# Patient Record
Sex: Male | Born: 1980 | Race: Black or African American | Hispanic: No | Marital: Single | Smoking: Never smoker
Health system: Southern US, Community
[De-identification: ages and names within clinical notes are randomized; demographics above are authoritative.]

## PROBLEM LIST (undated history)

## (undated) DIAGNOSIS — N390 Urinary tract infection, site not specified: Secondary | ICD-10-CM

## (undated) DIAGNOSIS — E669 Obesity, unspecified: Secondary | ICD-10-CM

---
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
## 2015-03-22 ENCOUNTER — Emergency Department (HOSPITAL_BASED_OUTPATIENT_CLINIC_OR_DEPARTMENT_OTHER)
Admission: EM | Admit: 2015-03-22 | Discharge: 2015-03-22 | Disposition: A | Discharge status: Home / Self Care | Payer: BLUE CROSS/BLUE SHIELD | Attending: Emergency Medicine | Admitting: Emergency Medicine

## 2015-03-22 ENCOUNTER — Encounter (HOSPITAL_BASED_OUTPATIENT_CLINIC_OR_DEPARTMENT_OTHER): Payer: Self-pay | Admitting: Emergency Medicine

## 2015-03-22 DIAGNOSIS — R300 Dysuria: Secondary | ICD-10-CM | POA: Diagnosis present

## 2015-03-22 DIAGNOSIS — N390 Urinary tract infection, site not specified: Secondary | ICD-10-CM | POA: Diagnosis not present

## 2015-03-22 DIAGNOSIS — E669 Obesity, unspecified: Secondary | ICD-10-CM | POA: Insufficient documentation

## 2015-03-22 HISTORY — DX: Obesity, unspecified: E66.9

## 2015-03-22 HISTORY — DX: Urinary tract infection, site not specified: N39.0

## 2015-03-22 LAB — URINALYSIS, ROUTINE W REFLEX MICROSCOPIC
BILIRUBIN URINE: NEGATIVE
GLUCOSE, UA: NEGATIVE mg/dL
KETONES UR: NEGATIVE mg/dL
NITRITE: NEGATIVE
PROTEIN: NEGATIVE mg/dL
Specific Gravity, Urine: 1.005 (ref 1.005–1.030)
Urobilinogen, UA: 0.2 mg/dL (ref 0.0–1.0)
pH: 7 (ref 5.0–8.0)

## 2015-03-22 LAB — GC/CHLAMYDIA PROBE AMP (~~LOC~~) NOT AT ARMC
Chlamydia: NEGATIVE
NEISSERIA GONORRHEA: NEGATIVE

## 2015-03-22 LAB — URINE MICROSCOPIC-ADD ON

## 2015-03-22 MED ORDER — CEFTRIAXONE SODIUM 1 G IJ SOLR
1.0000 g | Freq: Once | INTRAMUSCULAR | Status: AC
  Administered 2015-03-22: 1 g via INTRAMUSCULAR
  Filled 2015-03-22: qty 10

## 2015-03-22 MED ORDER — CEPHALEXIN 500 MG PO CAPS: 500.0000 mg | ORAL_CAPSULE | Freq: Four times a day (QID) | ORAL | Status: DC

## 2015-03-22 MED ORDER — AZITHROMYCIN 1 G PO PACK
1.0000 g | PACK | Freq: Once | ORAL | Status: AC
  Administered 2015-03-22: 1 g via ORAL
  Filled 2015-03-22: qty 1

## 2015-03-22 MED ORDER — PHENAZOPYRIDINE HCL 100 MG PO TABS
200.0000 mg | ORAL_TABLET | Freq: Once | ORAL | Status: AC
  Administered 2015-03-22: 200 mg via ORAL
  Filled 2015-03-22: qty 2

## 2015-03-22 NOTE — ED Notes (Signed)
Dr. Nicanor Alcon in to see pt, urethral swab obtained.

## 2015-03-22 NOTE — ED Provider Notes (Signed)
CSN: [id number]     Arrival date & time 03/22/15  0110 History   First MD Initiated Contact with Patient 03/22/15 0139     Chief Complaint  Patient presents with  . Dysuria     (Consider location/radiation/quality/duration/timing/severity/associated sxs/prior Treatment) Patient is a 34 y.o. male presenting with dysuria. The history is provided by the patient.  Dysuria This is a recurrent problem. The current episode started 1 to 2 hours ago. The problem occurs constantly. The problem has not changed since onset.Pertinent negatives include no chest pain, no abdominal pain, no headaches and no shortness of breath. Nothing aggravates the symptoms. Nothing relieves the symptoms. He has tried nothing for the symptoms. The treatment provided no relief.    Past Medical History  Diagnosis Date  . UTI (lower urinary tract infection)   . Obesity    History reviewed. No pertinent past surgical history. No family history on file. Social History  Substance Use Topics  . Smoking status: Never Smoker   . Smokeless tobacco: None  . Alcohol Use: No    Review of Systems  Respiratory: Negative for shortness of breath.   Cardiovascular: Negative for chest pain.  Gastrointestinal: Negative for abdominal pain.  Genitourinary: Positive for dysuria. Negative for difficulty urinating.  Neurological: Negative for headaches.  All other systems reviewed and are negative.     Allergies  Review of patient's allergies indicates no known allergies.  Home Medications   Prior to Admission medications   Medication Sig Start Date End Date Taking? Authorizing Provider  cephALEXin (KEFLEX) 500 MG capsule Take 1 capsule (500 mg total) by mouth 4 (four) times daily. 03/22/15   April Palumbo, MD   BP 151/101 mmHg  Pulse 92  Temp(Src) 100.5 F (38.1 C) (Oral)  Resp 22  Ht  (1.88 m)  Wt 349 lb (158.305 kg)  BMI 44.79 kg/m2  SpO2 100% Physical Exam  Constitutional: He is oriented to person, place,  and time. He appears well-developed and well-nourished. No distress.  HENT:  Head: Normocephalic and atraumatic.  Mouth/Throat: Oropharynx is clear and moist.  Eyes: Conjunctivae are normal. Pupils are equal, round, and reactive to light.  Neck: Normal range of motion. Neck supple.  Cardiovascular: Normal rate, regular rhythm and intact distal pulses.   Pulmonary/Chest: Effort normal and breath sounds normal. No respiratory distress. He has no wheezes. He has no rales.  Abdominal: Soft. Bowel sounds are normal. There is no tenderness. There is no rebound and no guarding.  Musculoskeletal: Normal range of motion.  Neurological: He is alert and oriented to person, place, and time.  Skin: Skin is warm and dry.  Psychiatric: He has a normal mood and affect.    ED Course  Procedures (including critical care time) Labs Review Labs Reviewed  URINALYSIS, ROUTINE W REFLEX MICROSCOPIC (NOT AT [facility]) - Abnormal; Notable for the following:    APPearance CLOUDY (*)    Hgb urine dipstick LARGE (*)    Leukocytes, UA LARGE (*)    All other components within normal limits  URINE MICROSCOPIC-ADD ON  GC/CHLAMYDIA PROBE AMP ([place]) NOT AT [facility]    Imaging Review No results found. I have personally reviewed and evaluated these images and lab results as part of my medical decision-making.   EKG Interpretation None      MDM   Final diagnoses:  UTI (lower urinary tract infection)    Medications  cefTRIAXone (ROCEPHIN) injection 1 g (1 g Intramuscular Given 03/22/15 0252)  azithromycin (ZITHROMAX) powder  1 g (1 g Oral Given 03/22/15 0253)  phenazopyridine (PYRIDIUM) tablet 200 mg (200 mg Oral Given 03/22/15 0334)    As this is a second visit for UTI in a male will refer to urology for further testing keflex started.  Return for fevers intractable vomiting weakness or any concerns    April Palumbo, MD 03/22/15 [phone]

## 2015-03-22 NOTE — ED Notes (Signed)
MD at bedside. 

## 2015-03-22 NOTE — ED Notes (Signed)
Urinary frequency and dysuria since 10pm last evening.

## 2021-04-12 ENCOUNTER — Emergency Department (HOSPITAL_COMMUNITY): Payer: BC Managed Care – PPO

## 2021-04-12 ENCOUNTER — Encounter (HOSPITAL_COMMUNITY): Payer: Self-pay

## 2021-04-12 ENCOUNTER — Inpatient Hospital Stay (HOSPITAL_COMMUNITY)
Admission: EM | Admit: 2021-04-12 | Discharge: 2021-04-16 | DRG: 519 | Disposition: A | Discharge status: Home / Self Care | Payer: BC Managed Care – PPO | Attending: Neurosurgery | Admitting: Neurosurgery

## 2021-04-12 ENCOUNTER — Other Ambulatory Visit: Payer: Self-pay

## 2021-04-12 DIAGNOSIS — I160 Hypertensive urgency: Secondary | ICD-10-CM | POA: Diagnosis present

## 2021-04-12 DIAGNOSIS — E876 Hypokalemia: Secondary | ICD-10-CM | POA: Diagnosis present

## 2021-04-12 DIAGNOSIS — Z6841 Body Mass Index (BMI) 40.0 and over, adult: Secondary | ICD-10-CM

## 2021-04-12 DIAGNOSIS — Z419 Encounter for procedure for purposes other than remedying health state, unspecified: Secondary | ICD-10-CM

## 2021-04-12 DIAGNOSIS — M4804 Spinal stenosis, thoracic region: Secondary | ICD-10-CM | POA: Diagnosis present

## 2021-04-12 DIAGNOSIS — Z20822 Contact with and (suspected) exposure to covid-19: Secondary | ICD-10-CM | POA: Diagnosis present

## 2021-04-12 DIAGNOSIS — M4800 Spinal stenosis, site unspecified: Secondary | ICD-10-CM | POA: Diagnosis present

## 2021-04-12 DIAGNOSIS — I10 Essential (primary) hypertension: Secondary | ICD-10-CM | POA: Diagnosis present

## 2021-04-12 DIAGNOSIS — G959 Disease of spinal cord, unspecified: Secondary | ICD-10-CM | POA: Diagnosis present

## 2021-04-12 LAB — MAGNESIUM: Magnesium: 1.8 mg/dL (ref 1.7–2.4)

## 2021-04-12 LAB — COMPREHENSIVE METABOLIC PANEL
ALT: 29 U/L (ref 0–44)
AST: 24 U/L (ref 15–41)
Albumin: 4 g/dL (ref 3.5–5.0)
Alkaline Phosphatase: 83 U/L (ref 38–126)
Anion gap: 9 (ref 5–15)
BUN: 10 mg/dL (ref 6–20)
CO2: 28 mmol/L (ref 22–32)
Calcium: 8.4 mg/dL — ABNORMAL LOW (ref 8.9–10.3)
Chloride: 101 mmol/L (ref 98–111)
Creatinine, Ser: 1.23 mg/dL (ref 0.61–1.24)
GFR, Estimated: >60 mL/min (ref >60)
Glucose, Bld: 120 mg/dL — ABNORMAL HIGH (ref 70–99)
Potassium: 2.9 mmol/L — ABNORMAL LOW (ref 3.5–5.1)
Sodium: 138 mmol/L (ref 135–145)
Total Bilirubin: 0.5 mg/dL (ref 0.3–1.2)
Total Protein: 7.6 g/dL (ref 6.5–8.1)

## 2021-04-12 LAB — CBC WITH DIFFERENTIAL/PLATELET
Abs Immature Granulocytes: 0.02 10*3/uL (ref 0.00–0.07)
Basophils Absolute: 0.1 10*3/uL (ref 0.0–0.1)
Basophils Relative: 1 %
Eosinophils Absolute: 0.2 10*3/uL (ref 0.0–0.5)
Eosinophils Relative: 2 %
HCT: 42.9 % (ref 39.0–52.0)
Hemoglobin: 13.7 g/dL (ref 13.0–17.0)
Immature Granulocytes: 0 %
Lymphocytes Relative: 22 %
Lymphs Abs: 1.8 10*3/uL (ref 0.7–4.0)
MCH: 27.8 pg (ref 26.0–34.0)
MCHC: 31.9 g/dL (ref 30.0–36.0)
MCV: 87 fL (ref 80.0–100.0)
Monocytes Absolute: 0.6 10*3/uL (ref 0.1–1.0)
Monocytes Relative: 8 %
Neutro Abs: 5.4 10*3/uL (ref 1.7–7.7)
Neutrophils Relative %: 67 %
Platelets: 333 10*3/uL (ref 150–400)
RBC: 4.93 MIL/uL (ref 4.22–5.81)
RDW: 13 % (ref 11.5–15.5)
WBC: 8 10*3/uL (ref 4.0–10.5)
nRBC: 0 % (ref 0.0–0.2)

## 2021-04-12 LAB — RESP PANEL BY RT-PCR (FLU A&B, COVID) ARPGX2
Influenza A by PCR: NEGATIVE
Influenza B by PCR: NEGATIVE
SARS Coronavirus 2 by RT PCR: NEGATIVE

## 2021-04-12 IMAGING — MR MR THORACIC SPINE W/O CM
6 series · 42 of 48 positions shown · non-contrast
Comparison: None.

CLINICAL DATA: Pain, progressive neurologic deficit, fall from
lower extremity weakness, with increasing lower extremity weakness
over the week.

EXAM:
MRI CERVICAL, THORACIC AND LUMBAR SPINE WITHOUT CONTRAST
TECHNIQUE: Multiplanar and multiecho pulse sequences of the cervical spine, to
include the craniocervical junction and cervicothoracic junction,
and thoracic and lumbar spine, were obtained without intravenous
contrast.

[Series 16: T1 · sagittal · 4.0mm · 1.72mm/px · 1 of 5 slices shown (1 of 2)]
[im 1/5]
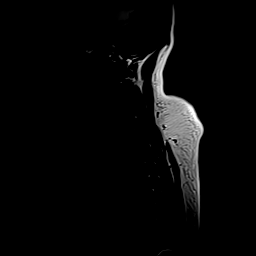

[Series 17: STIR · sagittal · 3.0mm · 1.09mm/px · 5 of 16 slices shown]
[im 1/16]
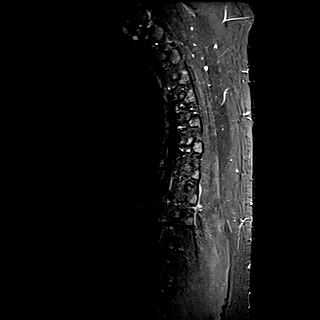
[im 4/16]
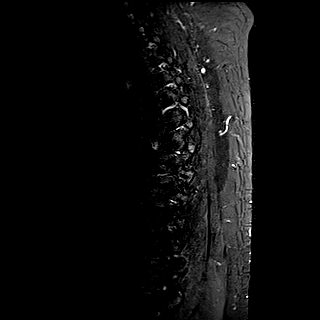
[im 8/16]
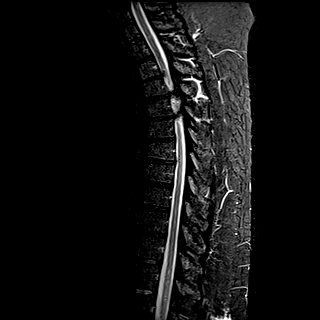
[im 12/16]
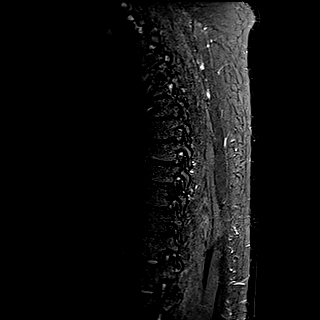
[im 16/16]
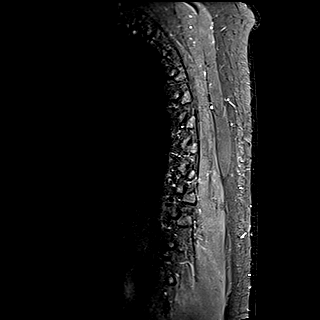

[Series 18: T1 · sagittal · 3.0mm · 1.09mm/px · 6 of 16 slices shown (2 of 2)]
[im 1/16]
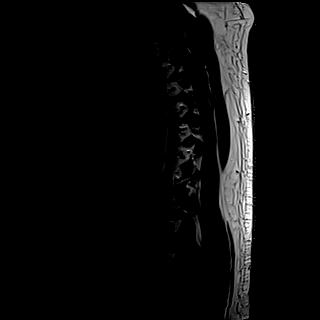
[im 4/16]
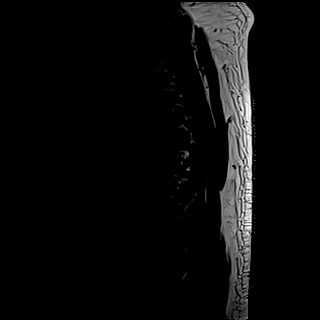
[im 7/16]
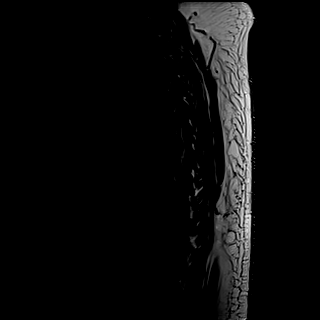
[im 10/16]
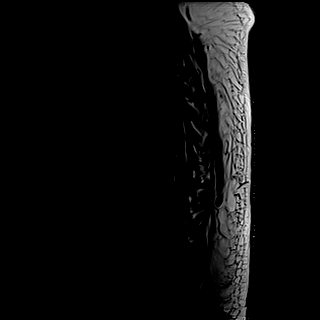
[im 13/16]
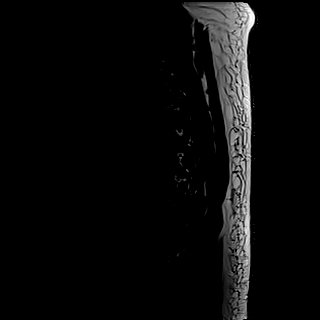
[im 16/16]
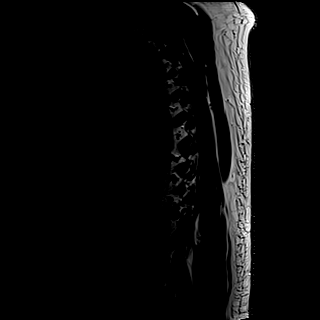

[Series 19: T2 · sagittal · 3.0mm · 0.91mm/px · 6 of 16 slices shown (1 of 2)]
[im 1/16]
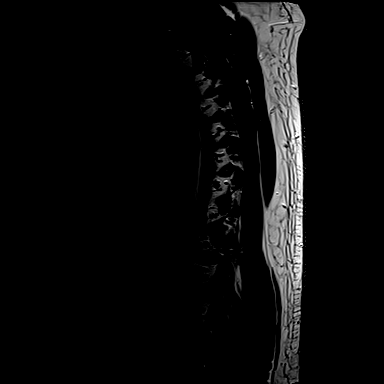
[im 4/16]
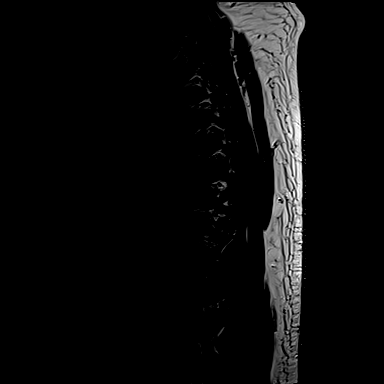
[im 7/16]
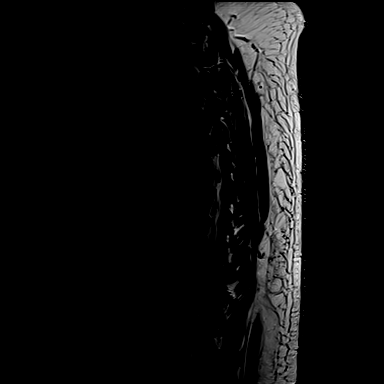
[im 10/16]
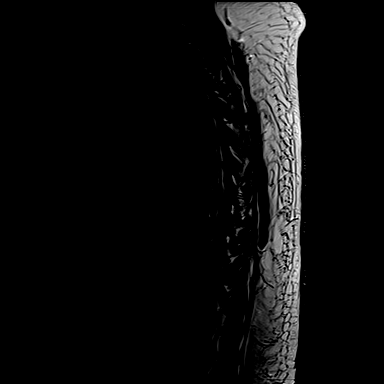
[im 13/16]
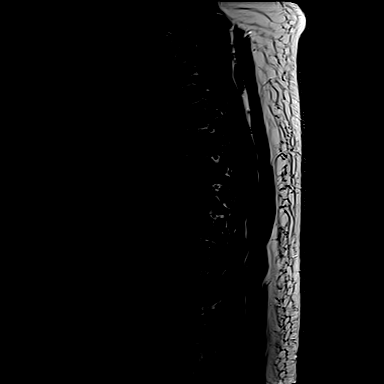
[im 16/16]
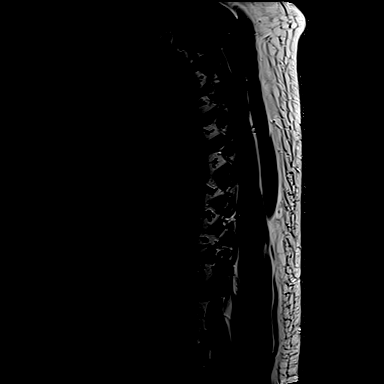

[Series 20: T2 · axial · 4.0mm · 0.78mm/px · z∈[-335,-78]mm · 15 of 42 slices shown (2 of 2)]
[im 1/42]
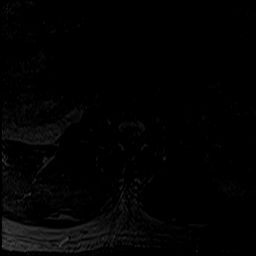
[im 3/42]
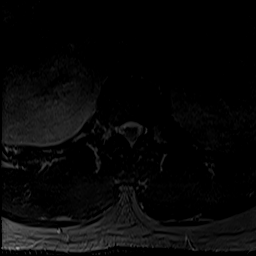
[im 6/42]
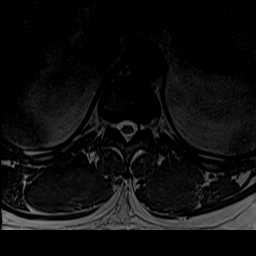
[im 9/42]
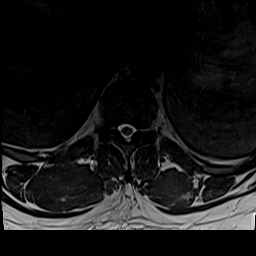
[im 12/42]
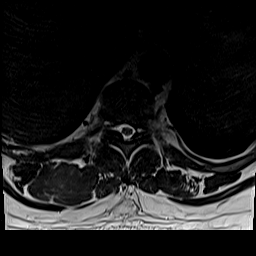
[im 15/42]
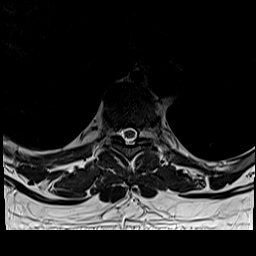
[im 18/42]
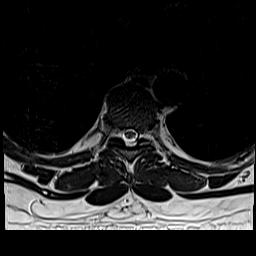
[im 21/42]
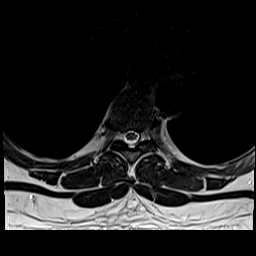
[im 24/42]
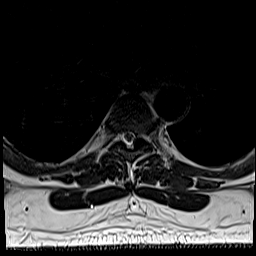
[im 27/42]
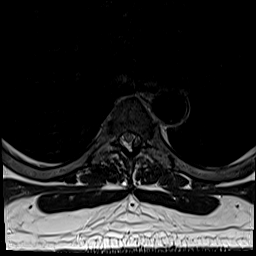
[im 30/42]
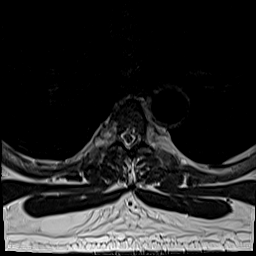
[im 33/42]
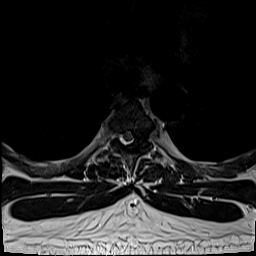
[im 36/42]
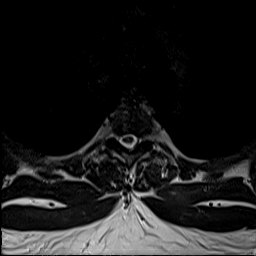
[im 39/42]
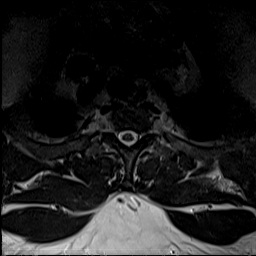
[im 42/42]
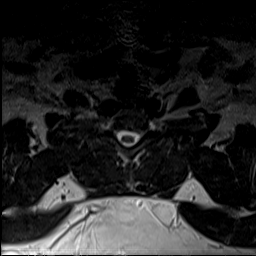

[Series 21: t2_me2d_tra · axial · 4.0mm · 0.52mm/px · z∈[-335,-78]mm · 9 of 42 slices shown]
[im 1/42]
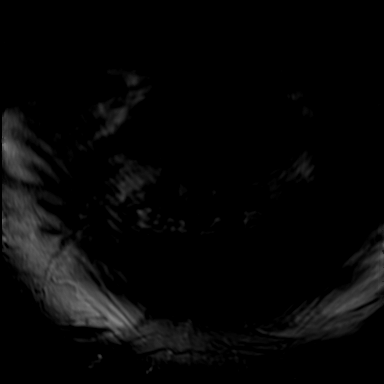
[im 3/42]
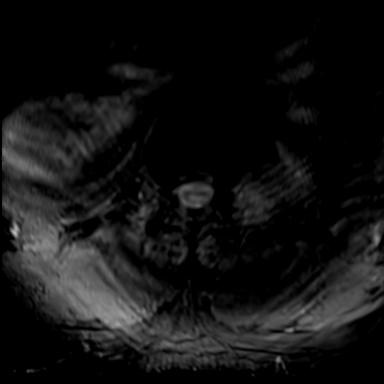
[im 6/42]
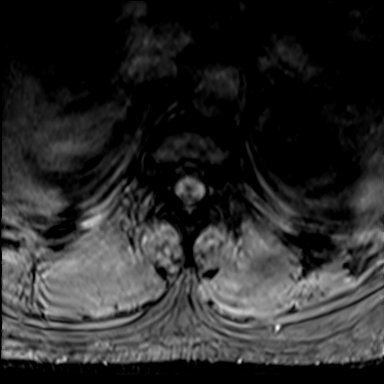
[im 12/42]
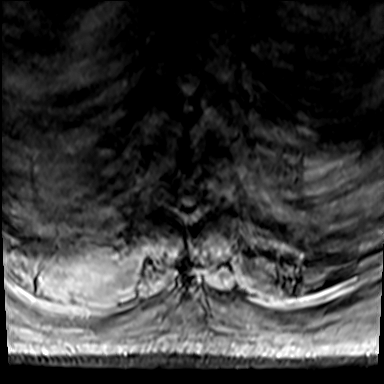
[im 18/42]
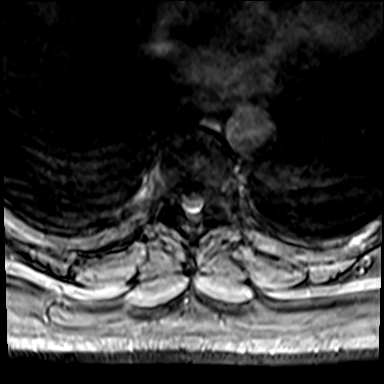
[im 24/42]
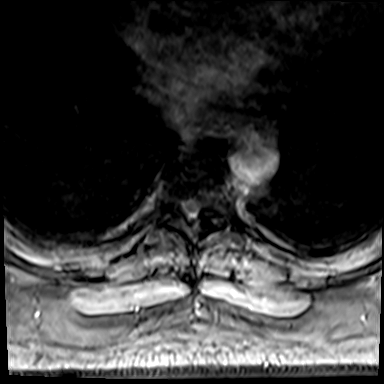
[im 30/42]
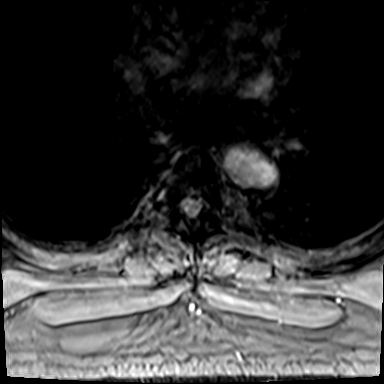
[im 36/42]
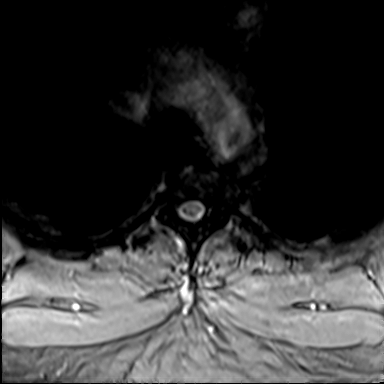
[im 42/42]
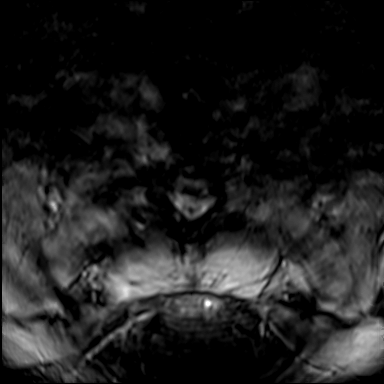

[42 of 48 positions shown; findings below may reference images not displayed]

FINDINGS: MRI CERVICAL SPINE FINDINGS

Alignment: Physiologic.

Vertebrae: No fracture, evidence of discitis, or bone lesion.

Cord: Normal signal and morphology.

Posterior Fossa, vertebral arteries, paraspinal tissues: Negative.

Disc levels:

C4-C5 mild left neural foraminal narrowing. No spinal canal
stenosis.

MRI THORACIC SPINE FINDINGS

Alignment:  Physiologic.

Vertebrae: No acute fracture. Osseous expansion of the vertebral
body and posterior elements of T4 (series 20, image 15). Osseous
expansion to a lesser extent of the left posterior aspect of the
vertebral body and pedicle at T3 (series 20, image 10) and T5
(series 20, image 20) no focal abnormal osseous signal is seen these
levels, however.

Cord: Focal compression of the spinal cord at the level of T4-T5,
with mildly increased T2 signal (series 20, image 15). The spinal
cord is otherwise normal in signal and morphology.

Paraspinal and other soft tissues: Negative.

Disc levels:

Severe spinal canal stenosis at T4-T5. Mild spinal canal stenosis at
T3-T4.

MRI LUMBAR SPINE FINDINGS

Segmentation:  Standard.

Alignment:  Physiologic.

Vertebrae:  No fracture, evidence of discitis, or bone lesion.

Conus medullaris and cauda equina: Conus extends to the L2 level.
Conus and cauda equina appear normal.

Paraspinal and other soft tissues: Negative.

Disc levels:

Moderate facet arthropathy at L4-L5, which causes mild left neural
foraminal narrowing. No other neural foraminal narrowing. No spinal
canal stenosis.
IMPRESSION: 1. Osseous expansion of the vertebral body and posterior elements
T4, with less extensive expansion of T3 and T5, which causes severe
spinal canal stenosis and focal spinal cord compression at T4-T5
with mildly increased T2 cord signal, likely edema. The osseous
expansion is nonspecific but may be degenerative, with prominent
posterior element hypertrophy and possible bulky ligamentous
ossification. Consider CT thoracic spine for further evaluation.
2. Mild spinal canal stenosis at T3-T4.
3. No other high-grade spinal canal stenosis. Otherwise mild
degenerative changes.

These results were called by telephone at the time of interpretation
on [DATE] at [DATE] to provider BENIN , who verbally
acknowledged these results.

## 2021-04-12 IMAGING — MR MR CERVICAL SPINE W/O CM
4 of 6 series · 31 of 48 positions shown · non-contrast
Comparison: None.

CLINICAL DATA: Pain, progressive neurologic deficit, fall from
lower extremity weakness, with increasing lower extremity weakness
over the week.

EXAM:
MRI CERVICAL, THORACIC AND LUMBAR SPINE WITHOUT CONTRAST
TECHNIQUE: Multiplanar and multiecho pulse sequences of the cervical spine, to
include the craniocervical junction and cervicothoracic junction,
and thoracic and lumbar spine, were obtained without intravenous
contrast.

[Series 5: T1 · sagittal · 3.0mm · 0.69mm/px · 5 of 15 slices shown (1 of 2)]
[im 1/15]
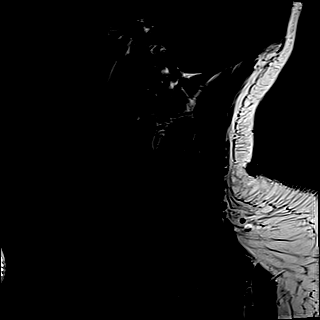
[im 4/15]
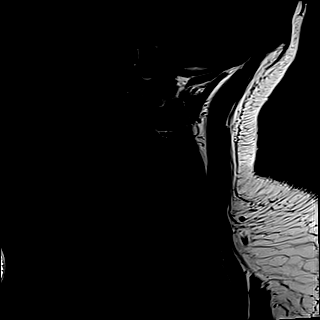
[im 8/15]
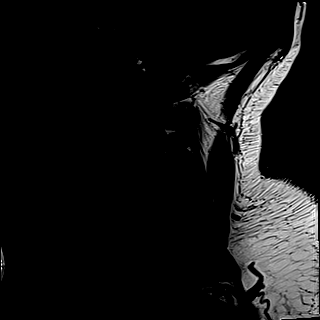
[im 11/15]
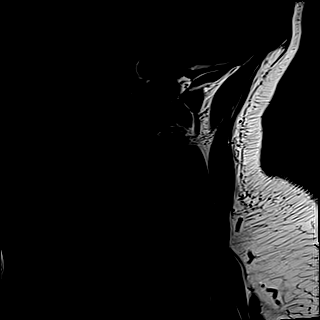
[im 15/15]
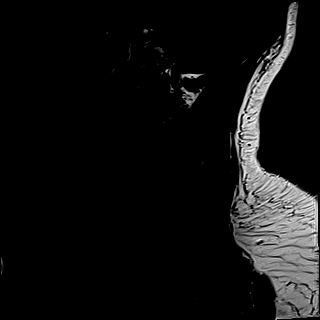

[Series 6: T2 · sagittal · 3.0mm · 0.69mm/px · 5 of 15 slices shown (1 of 2)]
[im 1/15]
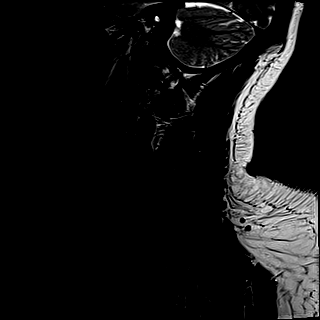
[im 4/15]
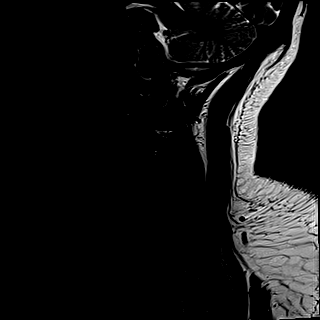
[im 8/15]
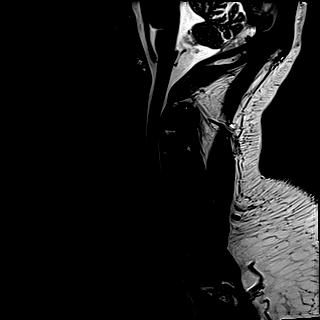
[im 11/15]
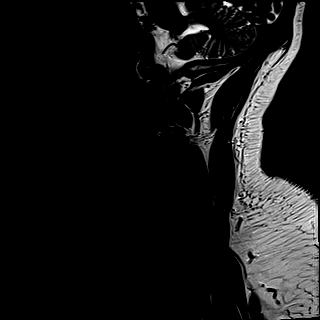
[im 15/15]
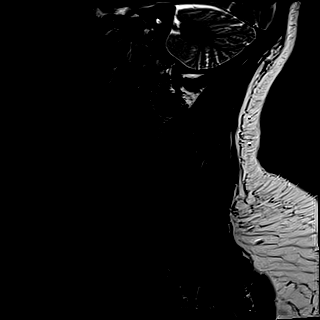

[Series 8: T2 · axial · 3.0mm · 0.70mm/px · z∈[-84,+24]mm · 11 of 32 slices shown (2 of 2)]
[im 1/32]
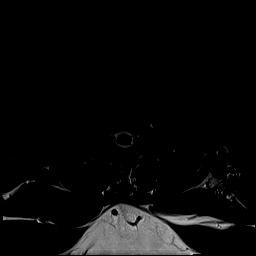
[im 4/32]
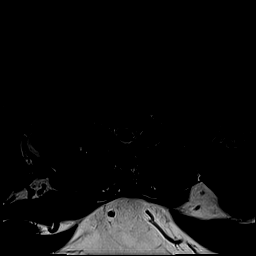
[im 7/32]
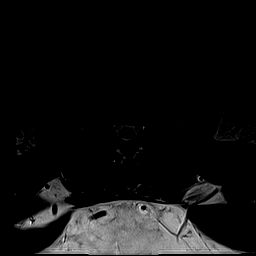
[im 10/32]
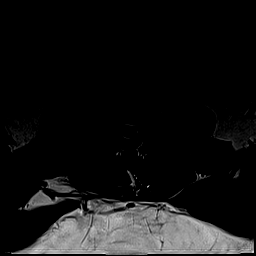
[im 13/32]
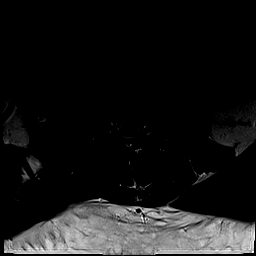
[im 16/32]
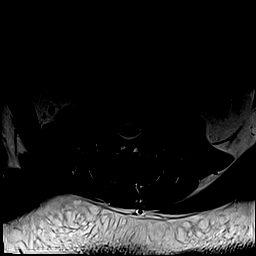
[im 19/32]
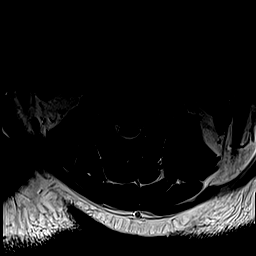
[im 22/32]
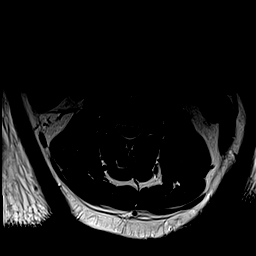
[im 25/32]
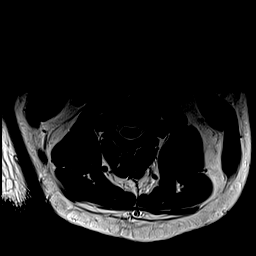
[im 28/32]
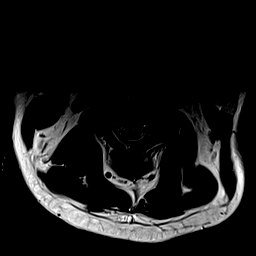
[im 32/32]
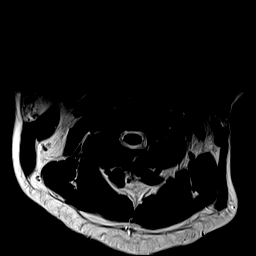

[Series 10: T1 · axial · 3.0mm · 0.35mm/px · z∈[-84,+10]mm · 10 of 32 slices shown (2 of 2)]
[im 1/32]
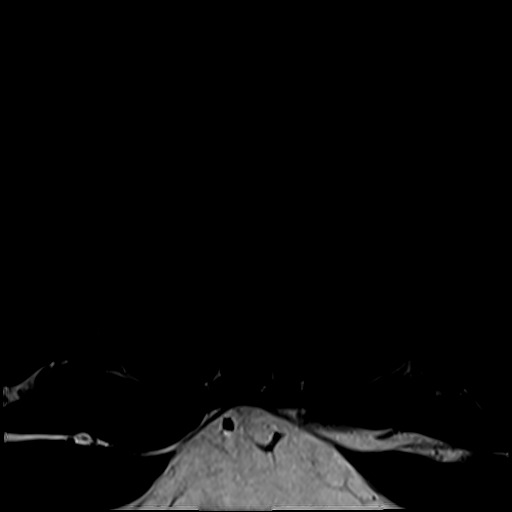
[im 4/32]
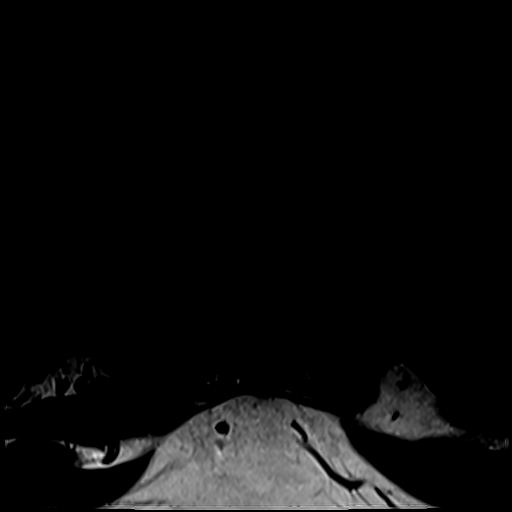
[im 7/32]
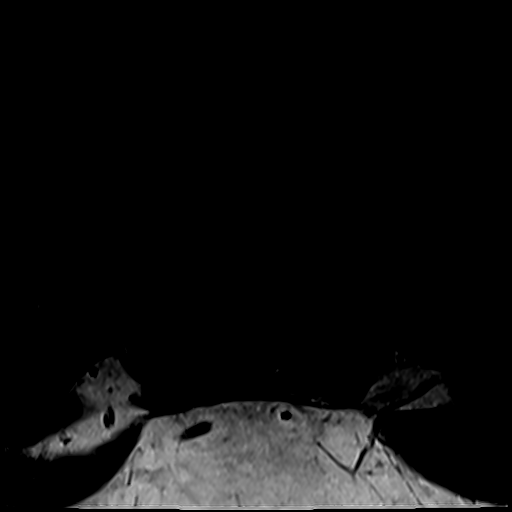
[im 10/32]
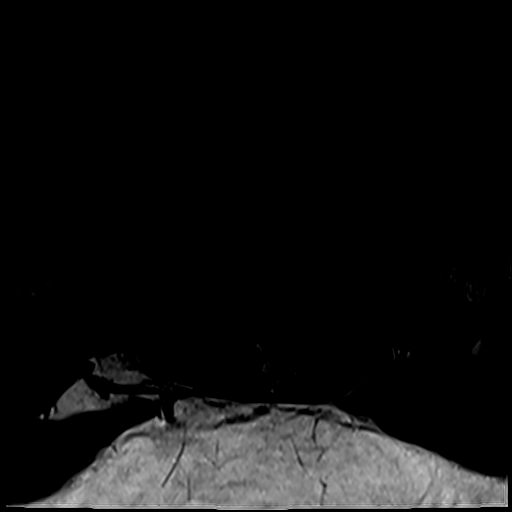
[im 13/32]
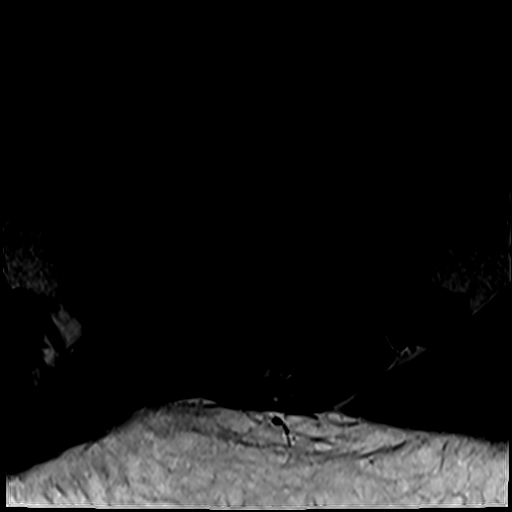
[im 16/32]
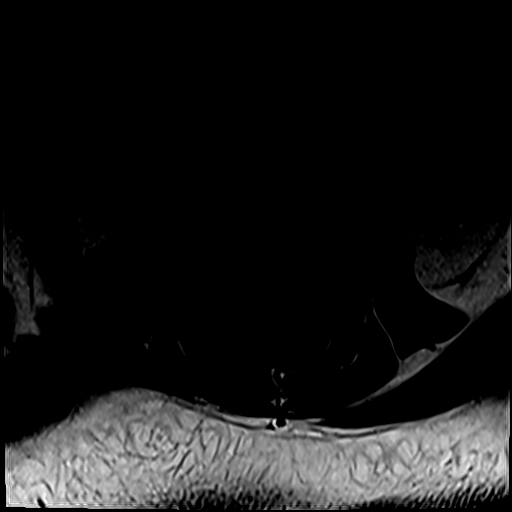
[im 19/32]
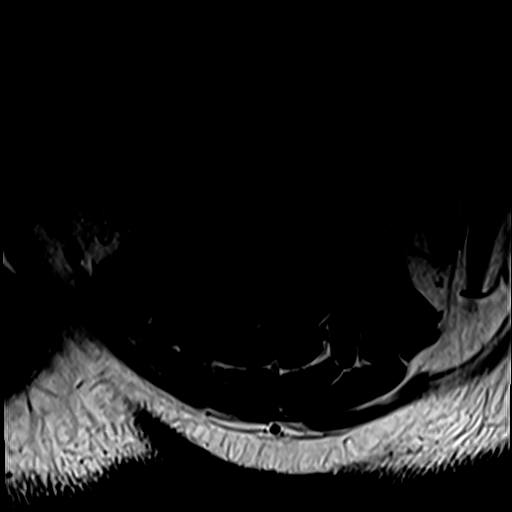
[im 22/32]
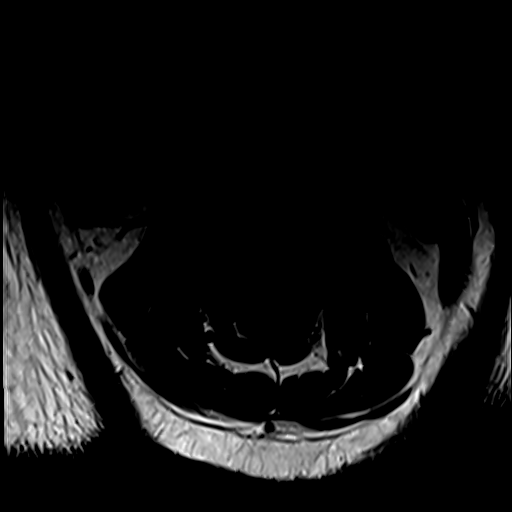
[im 25/32]
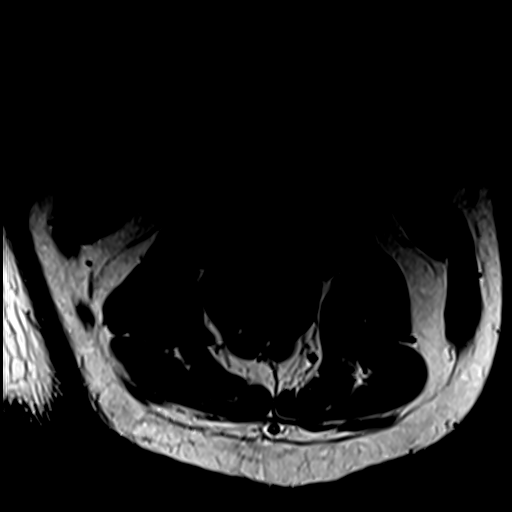
[im 28/32]
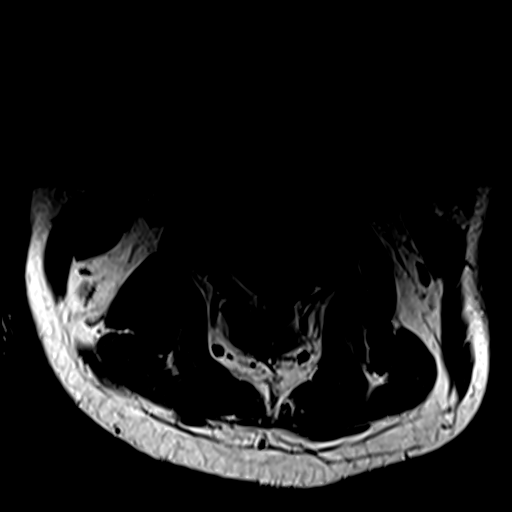

[31 of 48 positions shown; findings below may reference images not displayed]

FINDINGS: MRI CERVICAL SPINE FINDINGS

Alignment: Physiologic.

Vertebrae: No fracture, evidence of discitis, or bone lesion.

Cord: Normal signal and morphology.

Posterior Fossa, vertebral arteries, paraspinal tissues: Negative.

Disc levels:

C4-C5 mild left neural foraminal narrowing. No spinal canal
stenosis.

MRI THORACIC SPINE FINDINGS

Alignment:  Physiologic.

Vertebrae: No acute fracture. Osseous expansion of the vertebral
body and posterior elements of T4 (series 20, image 15). Osseous
expansion to a lesser extent of the left posterior aspect of the
vertebral body and pedicle at T3 (series 20, image 10) and T5
(series 20, image 20) no focal abnormal osseous signal is seen these
levels, however.

Cord: Focal compression of the spinal cord at the level of T4-T5,
with mildly increased T2 signal (series 20, image 15). The spinal
cord is otherwise normal in signal and morphology.

Paraspinal and other soft tissues: Negative.

Disc levels:

Severe spinal canal stenosis at T4-T5. Mild spinal canal stenosis at
T3-T4.

MRI LUMBAR SPINE FINDINGS

Segmentation:  Standard.

Alignment:  Physiologic.

Vertebrae:  No fracture, evidence of discitis, or bone lesion.

Conus medullaris and cauda equina: Conus extends to the L2 level.
Conus and cauda equina appear normal.

Paraspinal and other soft tissues: Negative.

Disc levels:

Moderate facet arthropathy at L4-L5, which causes mild left neural
foraminal narrowing. No other neural foraminal narrowing. No spinal
canal stenosis.
IMPRESSION: 1. Osseous expansion of the vertebral body and posterior elements
T4, with less extensive expansion of T3 and T5, which causes severe
spinal canal stenosis and focal spinal cord compression at T4-T5
with mildly increased T2 cord signal, likely edema. The osseous
expansion is nonspecific but may be degenerative, with prominent
posterior element hypertrophy and possible bulky ligamentous
ossification. Consider CT thoracic spine for further evaluation.
2. Mild spinal canal stenosis at T3-T4.
3. No other high-grade spinal canal stenosis. Otherwise mild
degenerative changes.

These results were called by telephone at the time of interpretation
on [DATE] at [DATE] to provider BENIN , who verbally
acknowledged these results.

## 2021-04-12 IMAGING — MR MR LUMBAR SPINE W/O CM
4 of 5 series · 30 of 48 positions shown · non-contrast
Comparison: None.

CLINICAL DATA: Pain, progressive neurologic deficit, fall from
lower extremity weakness, with increasing lower extremity weakness
over the week.

EXAM:
MRI CERVICAL, THORACIC AND LUMBAR SPINE WITHOUT CONTRAST
TECHNIQUE: Multiplanar and multiecho pulse sequences of the cervical spine, to
include the craniocervical junction and cervicothoracic junction,
and thoracic and lumbar spine, were obtained without intravenous
contrast.

[Series 9: T1 · sagittal · 4.0mm · 0.81mm/px · 6 of 15 slices shown (1 of 2)]
[im 1/15]
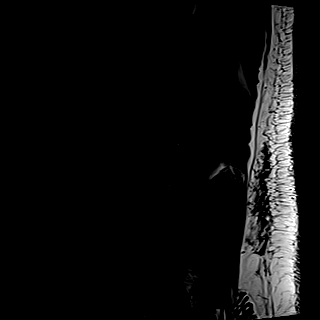
[im 3/15]
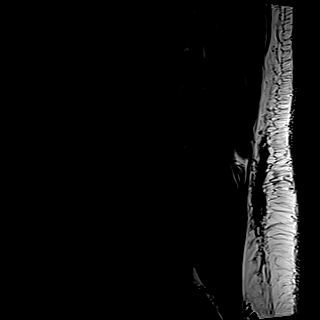
[im 6/15]
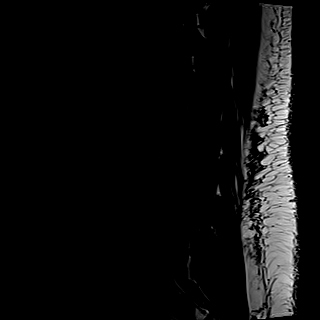
[im 9/15]
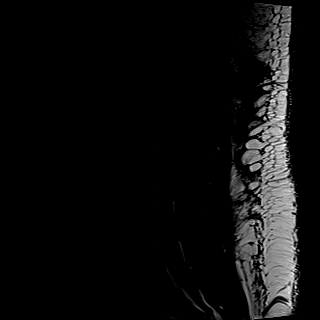
[im 12/15]
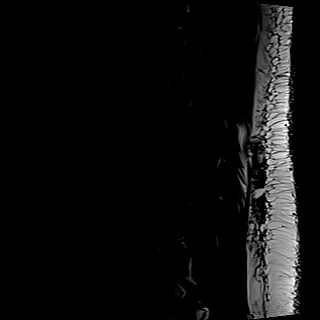
[im 15/15]
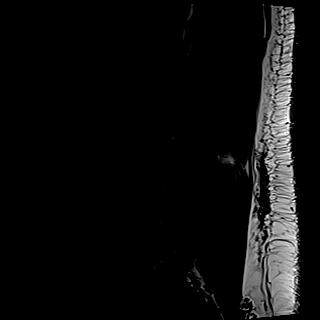

[Series 10: T2 · sagittal · 4.0mm · 0.81mm/px · 5 of 15 slices shown (1 of 2)]
[im 1/15]
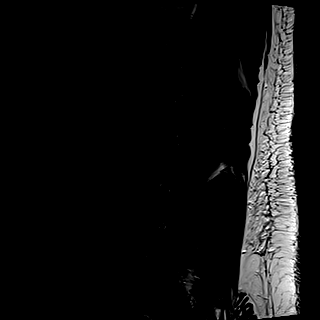
[im 4/15]
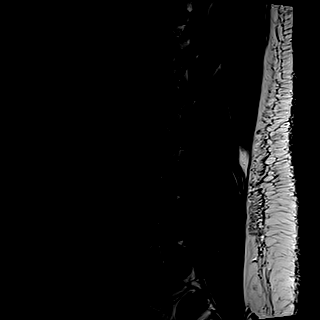
[im 8/15]
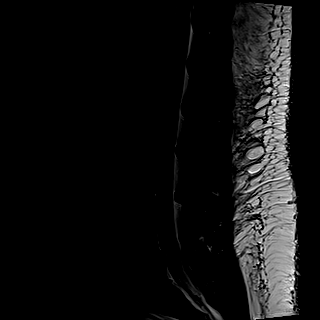
[im 11/15]
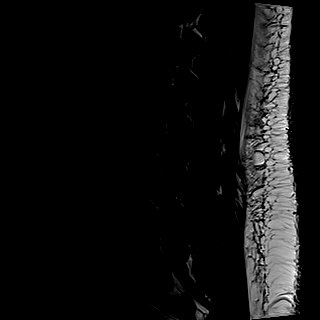
[im 15/15]
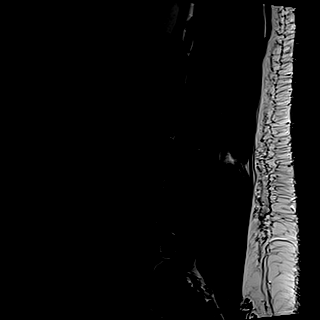

[Series 12: T2 · axial · 4.0mm · 0.62mm/px · z∈[-371,-146]mm · 10 of 45 slices shown (2 of 2)]
[im 3/45]
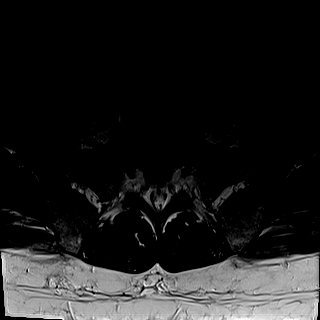
[im 6/45]
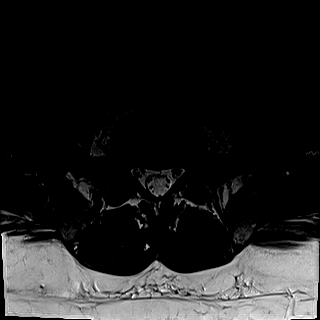
[im 9/45]
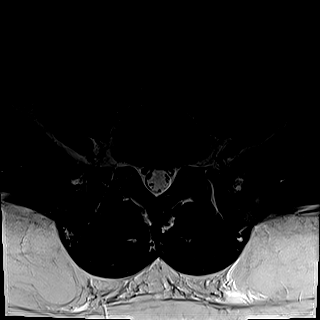
[im 15/45]
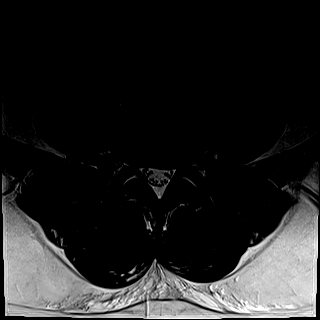
[im 21/45]
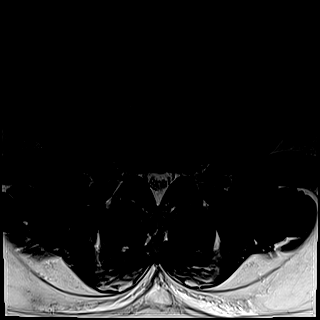
[im 24/45]
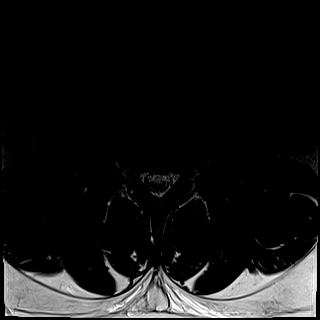
[im 27/45]
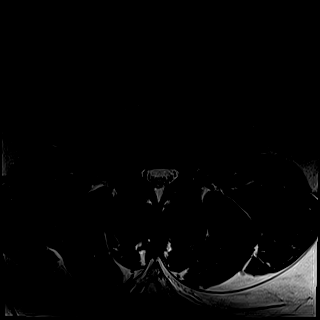
[im 33/45]
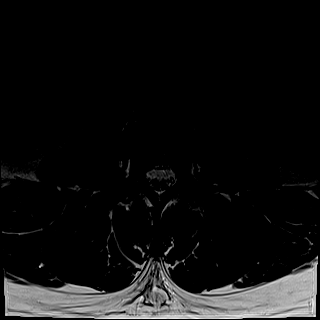
[im 39/45]
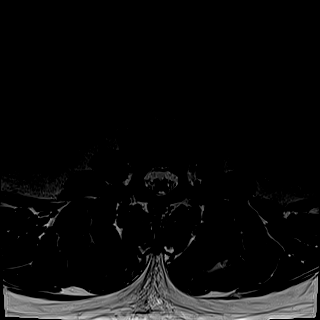
[im 45/45]
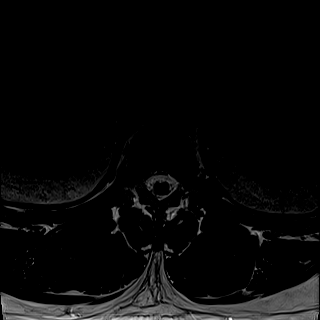

[Series 13: T1 · axial · 4.0mm · 0.39mm/px · z∈[-371,-176]mm · 9 of 45 slices shown (2 of 2)]
[im 3/45]
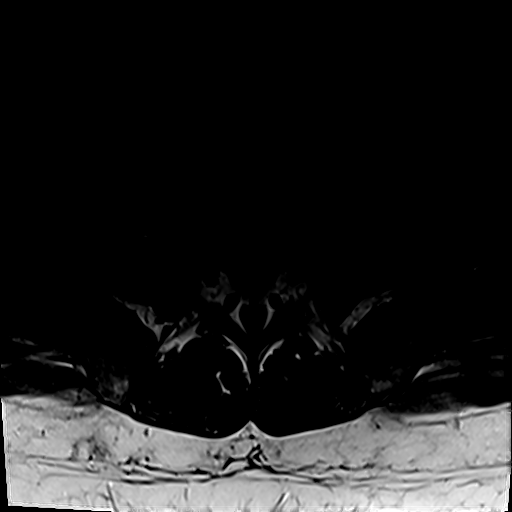
[im 6/45]
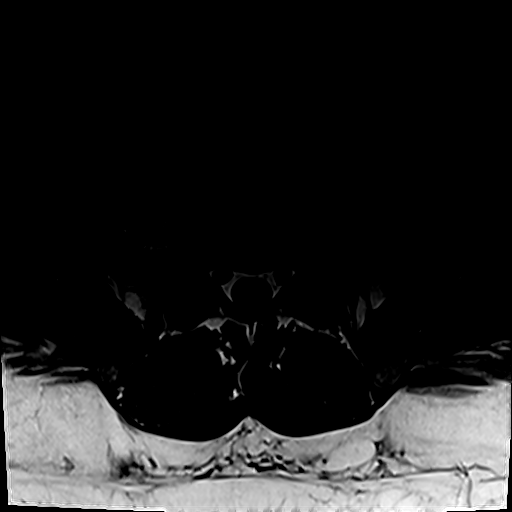
[im 9/45]
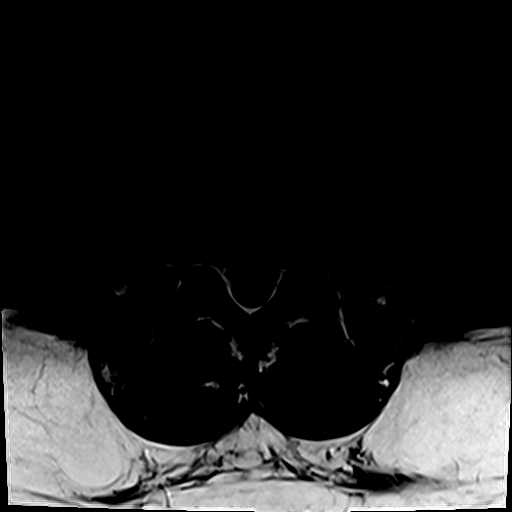
[im 15/45]
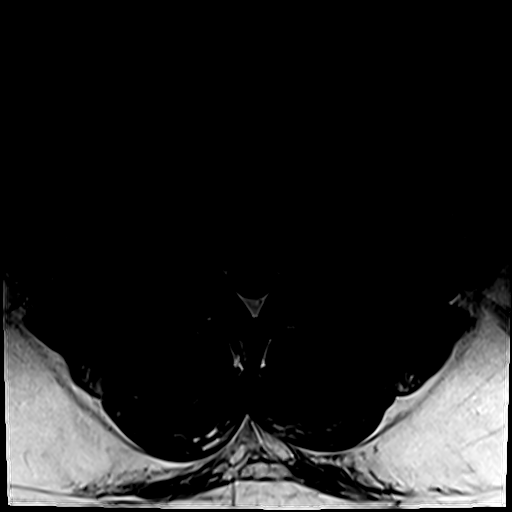
[im 21/45]
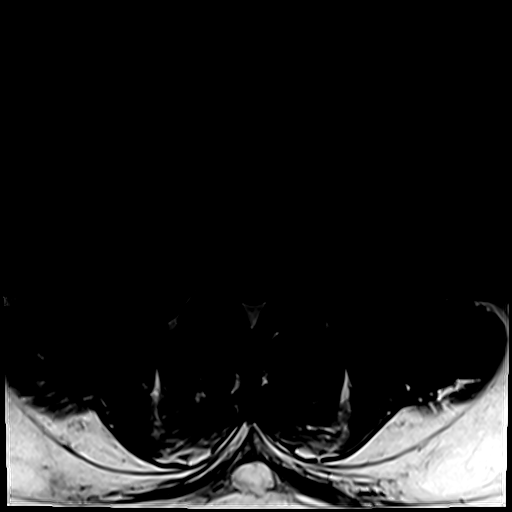
[im 24/45]
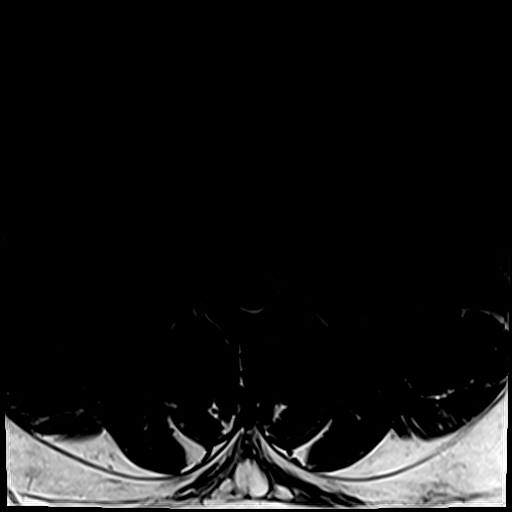
[im 27/45]
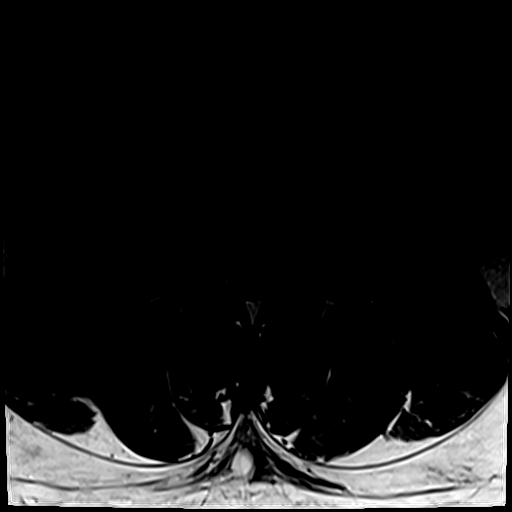
[im 33/45]
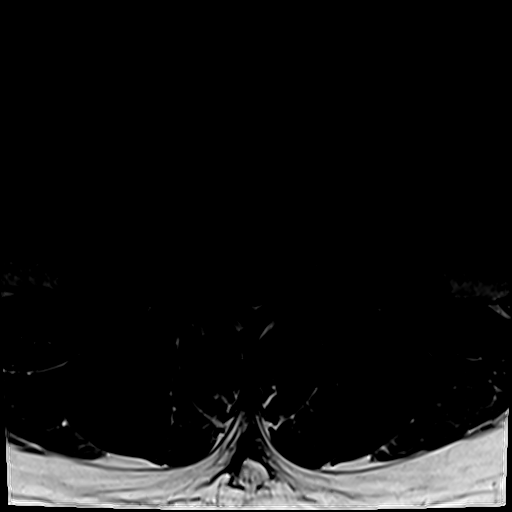
[im 39/45]
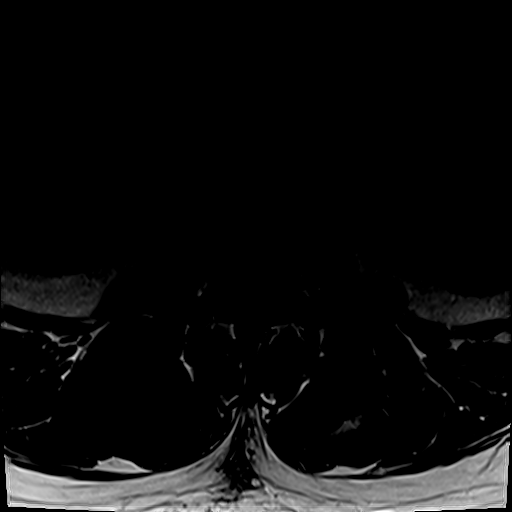

[30 of 48 positions shown; findings below may reference images not displayed]

FINDINGS: MRI CERVICAL SPINE FINDINGS

Alignment: Physiologic.

Vertebrae: No fracture, evidence of discitis, or bone lesion.

Cord: Normal signal and morphology.

Posterior Fossa, vertebral arteries, paraspinal tissues: Negative.

Disc levels:

C4-C5 mild left neural foraminal narrowing. No spinal canal
stenosis.

MRI THORACIC SPINE FINDINGS

Alignment:  Physiologic.

Vertebrae: No acute fracture. Osseous expansion of the vertebral
body and posterior elements of T4 (series 20, image 15). Osseous
expansion to a lesser extent of the left posterior aspect of the
vertebral body and pedicle at T3 (series 20, image 10) and T5
(series 20, image 20) no focal abnormal osseous signal is seen these
levels, however.

Cord: Focal compression of the spinal cord at the level of T4-T5,
with mildly increased T2 signal (series 20, image 15). The spinal
cord is otherwise normal in signal and morphology.

Paraspinal and other soft tissues: Negative.

Disc levels:

Severe spinal canal stenosis at T4-T5. Mild spinal canal stenosis at
T3-T4.

MRI LUMBAR SPINE FINDINGS

Segmentation:  Standard.

Alignment:  Physiologic.

Vertebrae:  No fracture, evidence of discitis, or bone lesion.

Conus medullaris and cauda equina: Conus extends to the L2 level.
Conus and cauda equina appear normal.

Paraspinal and other soft tissues: Negative.

Disc levels:

Moderate facet arthropathy at L4-L5, which causes mild left neural
foraminal narrowing. No other neural foraminal narrowing. No spinal
canal stenosis.
IMPRESSION: 1. Osseous expansion of the vertebral body and posterior elements
T4, with less extensive expansion of T3 and T5, which causes severe
spinal canal stenosis and focal spinal cord compression at T4-T5
with mildly increased T2 cord signal, likely edema. The osseous
expansion is nonspecific but may be degenerative, with prominent
posterior element hypertrophy and possible bulky ligamentous
ossification. Consider CT thoracic spine for further evaluation.
2. Mild spinal canal stenosis at T3-T4.
3. No other high-grade spinal canal stenosis. Otherwise mild
degenerative changes.

These results were called by telephone at the time of interpretation
on [DATE] at [DATE] to provider BENIN , who verbally
acknowledged these results.

## 2021-04-12 MED ORDER — ACETAMINOPHEN 650 MG RE SUPP: 650.0000 mg | Freq: Four times a day (QID) | RECTAL | Status: DC | PRN

## 2021-04-12 MED ORDER — LOSARTAN POTASSIUM 50 MG PO TABS
50.0000 mg | ORAL_TABLET | Freq: Every day | ORAL | Status: DC
  Administered 2021-04-12 – 2021-04-16 (×5): 50 mg via ORAL
  Filled 2021-04-12: qty 2
  Filled 2021-04-12 (×4): qty 1

## 2021-04-12 MED ORDER — POTASSIUM CHLORIDE CRYS ER 20 MEQ PO TBCR
40.0000 meq | EXTENDED_RELEASE_TABLET | Freq: Once | ORAL | Status: AC
Start: 2021-04-12 — End: 2021-04-12
  Administered 2021-04-12: 40 meq via ORAL
  Filled 2021-04-12: qty 2

## 2021-04-12 MED ORDER — HYDRALAZINE HCL 20 MG/ML IJ SOLN: 10.0000 mg | Freq: Three times a day (TID) | INTRAMUSCULAR | Status: DC | PRN

## 2021-04-12 MED ORDER — POTASSIUM CHLORIDE CRYS ER 20 MEQ PO TBCR
40.0000 meq | EXTENDED_RELEASE_TABLET | Freq: Every day | ORAL | Status: DC
  Administered 2021-04-13: 40 meq via ORAL
  Filled 2021-04-12 (×2): qty 2

## 2021-04-12 MED ORDER — HYDROCERIN EX CREA
TOPICAL_CREAM | Freq: Two times a day (BID) | CUTANEOUS | Status: DC
  Filled 2021-04-12: qty 113

## 2021-04-12 MED ORDER — ACETAMINOPHEN 325 MG PO TABS: 650.0000 mg | ORAL_TABLET | Freq: Four times a day (QID) | ORAL | Status: DC | PRN

## 2021-04-12 MED ORDER — METOPROLOL TARTRATE 5 MG/5ML IV SOLN
5.0000 mg | Freq: Once | INTRAVENOUS | Status: AC
  Administered 2021-04-12: 5 mg via INTRAVENOUS
  Filled 2021-04-12: qty 5

## 2021-04-12 MED ORDER — POTASSIUM CHLORIDE 10 MEQ/100ML IV SOLN
10.0000 meq | Freq: Once | INTRAVENOUS | Status: AC
  Administered 2021-04-12: 10 meq via INTRAVENOUS
  Filled 2021-04-12: qty 100

## 2021-04-12 MED ORDER — DEXAMETHASONE SODIUM PHOSPHATE 10 MG/ML IJ SOLN
6.0000 mg | Freq: Three times a day (TID) | INTRAMUSCULAR | Status: DC
  Administered 2021-04-12 – 2021-04-16 (×12): 6 mg via INTRAVENOUS
  Filled 2021-04-12 (×12): qty 1

## 2021-04-12 NOTE — ED Notes (Signed)
Attempted to call report. Nurse unavailable. Will try again.

## 2021-04-12 NOTE — ED Notes (Signed)
Patient transported to MRI 

## 2021-04-12 NOTE — ED Notes (Signed)
Pt in MRI, will update V/S when he returns

## 2021-04-12 NOTE — H&P (Signed)
Subjective: The patient is a 40 year old hypertensive obese black male non-smoker who began having back pain after he lifted some bottled water in February 2022.  Things worsened about 10 days ago when he began having lower extremity numbness, tingling and weakness.  He fell because his legs gave out today and came to the [facility], ER.  He was worked up with a cervical, thoracic and lumbar MRI which demonstrated severe spinal stenosis at T4-5 with evidence of spinal cord signal change.  The remainder of his MRIs were unremarkable.  A neurosurgical consultation was requested.  Presently the patient is alert and pleasant.  He complains of upper thoracic pain and right greater left lower extremity numbness, tingling and weakness.  He is not anticoagulated.  Past Medical History:  Diagnosis Date   Obesity    UTI (lower urinary tract infection)     History reviewed. No pertinent surgical history.  No Known Allergies  Social History   Tobacco Use   Smoking status: Never   Smokeless tobacco: Never  Substance Use Topics   Alcohol use: No    History reviewed. No pertinent family history. Prior to Admission medications   Not on File     Review of Systems  Positive ROS: He denies neck pain, low back pain  All other systems have been reviewed and were otherwise negative with the exception of those mentioned in the HPI and as above.  Objective: Vital signs in last 24 hours: Temp:  [98.9 F (37.2 C)] 98.9 F (37.2 C) (10/14 0815) Pulse Rate:  [70-93] 82 (10/14 1700) Resp:  [11-21] 14 (10/14 1700) BP: (163-210)/(104-135) 163/104 (10/14 1700) SpO2:  [98 %-100 %] 98 % (10/14 1700) Weight:  [149.7 kg-156.5 kg] 149.7 kg (10/14 0818) Estimated body mass index is 42.37 kg/m as calculated from the following:   Height as of this encounter: 6\' 2"  (1.88 m).   Weight as of this encounter: 149.7 kg.   Physical exam:  General: An alert and pleasant obese black male in no apparent  distress  HEENT: Normocephalic, atraumatic, pupils equal round reactive light, extraocular muscles are intact  Neck: Supple with normal range of motion no deformities.  Thorax: Symmetric  Abdomen: Obese and soft  Extremities: Unremarkable  Neurologic exam: The patient is alert and oriented x3.  Cranial nerves II through XII are examined bilaterally and grossly normal.  Vision and hearing are grossly normal bilaterally.  The patient's motor strength is 5/5 in spinal bicep, tricep, handgrip, and deltoid.  The patient has slight weakness at 4+ or 5 in his left iliopsoas and quadricep.  His left gastrocnemius and dorsiflexor strength is fairly normal.  The patient has weakness in his right iliopsoas and quadricep at 4-/5.  His gastrocnemius and dorsiflexors are 4+/5 on the right.  The patient has numbness and tingling in his bilateral lower extremities.  He is hyperreflexic in his bilateral quadricep and gastrocnemius.  He has sustained ankle clonus on the right and nonsustained ankle clonus on the left.  I reviewed the patient's cervical and lumbar MRI performed at [facility] today.  They are unremarkable.  I reviewed the patient's thoracic MRI performed at [facility] today.  The patient has severe multifactoral spinal stenosis at T4-5 with evidence of spinal cord signal change.  I will see significant neural compression at other levels.  Data Review Lab Results  Component Value Date   WBC 8.0 04/12/2021   HGB 13.7 04/12/2021   HCT 42.9 04/12/2021  MCV 87.0 04/12/2021   PLT 333 04/12/2021   Lab Results  Component Value Date   NA 138 04/12/2021   K 2.9 (L) 04/12/2021   CL 101 04/12/2021   CO2 28 04/12/2021   BUN 10 04/12/2021   CREATININE 1.23 04/12/2021   GLUCOSE 120 (H) 04/12/2021   No results found for: INR, PROTIME  Assessment/Plan: Thoracic spinal stenosis, thoracic spine pain, thoracic myelopathy, paraparesis: I have discussed the situation with the  patient.  I recommended he be transferred to [facility] in preparation for a decompressive thoracic laminectomy next week.  I briefly discussed surgery with him.  He is agreeable.  In the meantime we will start IV Decadron.  I have answered all his questions.  I appreciate Dr. Everlean Patterson help with the patient's hypertension.   Cristi Loron 04/12/2021 5:55 PM

## 2021-04-12 NOTE — ED Notes (Signed)
[facility] called for transposrt

## 2021-04-12 NOTE — Consult Note (Signed)
Medical Consultation  Cristian Wilson WUJ:[id number] DOB: 22-May-1981 DOA: 04/12/2021 PCP: Patient, No Pcp Per (Inactive)   Requesting physician: Dr. Lovell Sheehan Date of consultation: 04/12/21 Reason for consultation: Medical Mgmt  Impression/Recommendations HTN     - PRN hydralazine     - start cozaar 50mg  PO qday     - no previous diagnosis of HTN; has not followed with a PCP before, rec he find PCP at discharge; consult TOC  Hypokalemia     - given 10 mEq IV K+ and 40 mEq PO K+ by ED; Mg2+ is good; repeat AM labs  Morbid Obesity     - counseled on diet/lifestyle changes; needs PCP follow up  BLE paraesthesia     - admitted to neurosurgery, inpt, tele at [facility]     - continue decadron 6mg  q8hr     - MRI spine w/ severe spinal canal stenosis and focal spinal cord compression at T4-T5 with mildly increased T2 cord signal, likely edema      - MSK str 4/5 in LLE, 3/5 in RLE, decreased plantar/dorsiflexion of right foot w/ decreased sensation noted in R foot on exam     - further imagining, plan per primary team  [facility] will follow-up again tomorrow. Please contact me if I can be of assistance in the meanwhile. Thank you for this consultation.  Chief Complaint: BLE weakness, numbness  HPI:  Cristian Wilson is a 40 y.o. male with medical history significant of morbid obesity. Presenting with BLE weakness. He reports that he's had a tingling in his back for months. He notes that when he bends his head down, he feels a tingling from his neck down to the bottom of his feet. This was persistent for months. He did not follow up with anyone about it. 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 It's been difficult for him to lift his right leg and he seems to be hobbling around more gingerly. There was no complaint of leg pain, but there was complaint of numbness, tingling and weakness. This morning when he was grocery shopping, he felt his right leg  get weak and give out. He started to slide to the floor. He tried to catch himself with the left leg, but it became weak and numb too. He fell to the floor and was unable to stand. EMS was called and he was brought to the ED eval. He denies any other aggravating or alleviating factors.   Review of Systems:  Denies CP, dyspnea, palpitations, N/V/D, ab pain, syncopal episodes, slurred speech, incontinence. Remainder of ROS is negative for all not mentioned in HPI  Past Medical History:  Diagnosis Date   Obesity    UTI (lower urinary tract infection)    History reviewed. No pertinent surgical history. Social History:  reports that he has never smoked. He has never used smokeless tobacco. He reports that he does not drink alcohol and does not use drugs.  No Known Allergies History reviewed. No pertinent family history.  Prior to Admission medications   Not on File   Physical Exam: Blood pressure (!) 180/110, pulse 80, temperature 98.9 F (37.2 C), temperature source Oral, resp. rate 17, height 6\' 2"  (1.88 m), weight (!) 149.7 kg, SpO2 100 %. Vitals:   04/12/21 1330 04/12/21 1430  BP: (!) 185/113 (!) 180/110  Pulse: 82 80  Resp: 15 17  Temp:    SpO2: 100% 100%    General: 40 y.o.  male resting in bed in NAD Eyes: PERRL, normal sclera ENMT: Nares patent w/o discharge, orophaynx clear, dentition normal, ears w/o discharge/lesions/ulcers Neck: Supple, trachea midline Cardiovascular: RRR, +S1, S2, no m/g/r, equal pulses throughout Respiratory: CTABL, no w/r/r, normal WOB GI: BS+, NDNT, no masses noted, no organomegaly noted MSK: No e/c/c; dry skin Neuro: A&O x 3, MSK str 4/5 in LLE, 3/5 in RLE, decreased plantar/dorsiflexion of right foot w/ decreased sensation noted in R foot on exam Psyc: Appropriate interaction and affect, calm/cooperative  Labs on Admission:  Basic Metabolic Panel: Recent Labs  Lab 04/12/21 0916  NA 138  K 2.9*  CL 101  CO2 28  GLUCOSE 120*  BUN 10   CREATININE 1.23  CALCIUM 8.4*  MG 1.8   Liver Function Tests: Recent Labs  Lab 04/12/21 0916  AST 24  ALT 29  ALKPHOS 83  BILITOT 0.5  PROT 7.6  ALBUMIN 4.0   No results for input(s): LIPASE, AMYLASE in the last 168 hours. No results for input(s): AMMONIA in the last 168 hours. CBC: Recent Labs  Lab 04/12/21 0916  WBC 8.0  NEUTROABS 5.4  HGB 13.7  HCT 42.9  MCV 87.0  PLT 333   Cardiac Enzymes: No results for input(s): CKTOTAL, CKMB, CKMBINDEX, TROPONINI in the last 168 hours. BNP: Invalid input(s): POCBNP CBG: No results for input(s): GLUCAP in the last 168 hours.  Radiological Exams on Admission: MR Cervical Spine Wo Contrast  Result Date: 04/12/2021 CLINICAL DATA:  Pain, progressive neurologic deficit, fall from lower extremity weakness, with increasing lower extremity weakness over the week. EXAM: MRI CERVICAL, THORACIC AND LUMBAR SPINE WITHOUT CONTRAST TECHNIQUE: Multiplanar and multiecho pulse sequences of the cervical spine, to include the craniocervical junction and cervicothoracic junction, and thoracic and lumbar spine, were obtained without intravenous contrast. COMPARISON:  None. FINDINGS: MRI CERVICAL SPINE FINDINGS Alignment: Physiologic. Vertebrae: No fracture, evidence of discitis, or bone lesion. Cord: Normal signal and morphology. Posterior Fossa, vertebral arteries, paraspinal tissues: Negative. Disc levels: C4-C5 mild left neural foraminal narrowing. No spinal canal stenosis. MRI THORACIC SPINE FINDINGS Alignment:  Physiologic. Vertebrae: No acute fracture. Osseous expansion of the vertebral body and posterior elements of T4 (series 20, image 15). Osseous expansion to a lesser extent of the left posterior aspect of the vertebral body and pedicle at T3 (series 20, image 10) and T5 (series 20, image 20) no focal abnormal osseous signal is seen these levels, however. Cord: Focal compression of the spinal cord at the level of T4-T5, with mildly increased T2  signal (series 20, image 15). The spinal cord is otherwise normal in signal and morphology. Paraspinal and other soft tissues: Negative. Disc levels: Severe spinal canal stenosis at T4-T5. Mild spinal canal stenosis at T3-T4. MRI LUMBAR SPINE FINDINGS Segmentation:  Standard. Alignment:  Physiologic. Vertebrae:  No fracture, evidence of discitis, or bone lesion. Conus medullaris and cauda equina: Conus extends to the L2 level. Conus and cauda equina appear normal. Paraspinal and other soft tissues: Negative. Disc levels: Moderate facet arthropathy at L4-L5, which causes mild left neural foraminal narrowing. No other neural foraminal narrowing. No spinal canal stenosis. IMPRESSION: 1. 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 The osseous expansion is nonspecific but may be degenerative, with prominent posterior element hypertrophy and possible bulky ligamentous ossification. Consider CT thoracic spine for  further evaluation. 2. Mild spinal canal stenosis at T3-T4. 3. No other high-grade spinal canal stenosis. Otherwise mild degenerative changes. These results were called by telephone at the time of interpretation on 04/12/2021 at 1:11 pm to provider MELANIE BELFI , who verbally acknowledged these results. Electronically Signed   By: Wiliam Ke M.D.   On: 04/12/2021 13:11   MR THORACIC SPINE WO CONTRAST  Result Date: 04/12/2021 CLINICAL DATA:  Pain, progressive neurologic deficit, fall from lower extremity weakness, with increasing lower extremity weakness over the week. EXAM: MRI CERVICAL, THORACIC AND LUMBAR SPINE WITHOUT CONTRAST TECHNIQUE: Multiplanar and multiecho pulse sequences of the cervical spine, to include the craniocervical junction and cervicothoracic junction, and thoracic and lumbar spine,  were obtained without intravenous contrast. COMPARISON:  None. FINDINGS: MRI CERVICAL SPINE FINDINGS Alignment: Physiologic. Vertebrae: No fracture, evidence of discitis, or bone lesion. Cord: Normal signal and morphology. Posterior Fossa, vertebral arteries, paraspinal tissues: Negative. Disc levels: C4-C5 mild left neural foraminal narrowing. No spinal canal stenosis. MRI THORACIC SPINE FINDINGS Alignment:  Physiologic. Vertebrae: No acute fracture. Osseous expansion of the vertebral body and posterior elements of T4 (series 20, image 15). Osseous expansion to a lesser extent of the left posterior aspect of the vertebral body and pedicle at T3 (series 20, image 10) and T5 (series 20, image 20) no focal abnormal osseous signal is seen these levels, however. Cord: Focal compression of the spinal cord at the level of T4-T5, with mildly increased T2 signal (series 20, image 15). The spinal cord is otherwise normal in signal and morphology. Paraspinal and other soft tissues: Negative. Disc levels: Severe spinal canal stenosis at T4-T5. Mild spinal canal stenosis at T3-T4. MRI LUMBAR SPINE FINDINGS Segmentation:  Standard. Alignment:  Physiologic. Vertebrae:  No fracture, evidence of discitis, or bone lesion. Conus medullaris and cauda equina: Conus extends to the L2 level. Conus and cauda equina appear normal. Paraspinal and other soft tissues: Negative. Disc levels: Moderate facet arthropathy at L4-L5, which causes mild left neural foraminal narrowing. No other neural foraminal narrowing. No spinal canal stenosis. IMPRESSION: 1. 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 The osseous expansion is nonspecific but may be degenerative, with prominent posterior element hypertrophy and possible bulky ligamentous  ossification. Consider CT thoracic spine for further evaluation. 2. Mild spinal canal stenosis at T3-T4. 3. No other high-grade spinal canal stenosis. Otherwise mild degenerative changes. These results were called by telephone at the time of interpretation on 04/12/2021 at 1:11 pm to provider MELANIE BELFI , who verbally acknowledged these results. Electronically Signed   By: Wiliam Ke M.D.   On: 04/12/2021 13:11   MR LUMBAR SPINE WO CONTRAST  Result Date: 04/12/2021 CLINICAL DATA:  Pain, progressive neurologic deficit, fall from lower extremity weakness, with increasing lower extremity weakness over the week. EXAM: MRI CERVICAL, THORACIC AND LUMBAR SPINE WITHOUT CONTRAST TECHNIQUE: Multiplanar and multiecho pulse sequences of the cervical spine, to include the craniocervical junction and cervicothoracic junction, and thoracic and lumbar spine, were obtained without intravenous contrast. COMPARISON:  None. FINDINGS: MRI CERVICAL SPINE FINDINGS Alignment: Physiologic. Vertebrae: No fracture, evidence of discitis, or bone lesion. Cord: Normal signal and morphology. Posterior Fossa, vertebral arteries, paraspinal tissues: Negative. Disc levels: C4-C5 mild left neural foraminal narrowing. No spinal canal stenosis. MRI THORACIC SPINE FINDINGS Alignment:  Physiologic. Vertebrae: No  acute fracture. Osseous expansion of the vertebral body and posterior elements of T4 (series 20, image 15). Osseous expansion to a lesser extent of the left posterior aspect of the vertebral body and pedicle at T3 (series 20, image 10) and T5 (series 20, image 20) no focal abnormal osseous signal is seen these levels, however. Cord: Focal compression of the spinal cord at the level of T4-T5, with mildly increased T2 signal (series 20, image 15). The spinal cord is otherwise normal in signal and morphology. Paraspinal and other soft tissues: Negative. Disc levels: Severe spinal canal stenosis at T4-T5. Mild spinal canal stenosis at  T3-T4. MRI LUMBAR SPINE FINDINGS Segmentation:  Standard. Alignment:  Physiologic. Vertebrae:  No fracture, evidence of discitis, or bone lesion. Conus medullaris and cauda equina: Conus extends to the L2 level. Conus and cauda equina appear normal. Paraspinal and other soft tissues: Negative. Disc levels: Moderate facet arthropathy at L4-L5, which causes mild left neural foraminal narrowing. No other neural foraminal narrowing. No spinal canal stenosis. IMPRESSION: 1. 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 The osseous expansion is nonspecific but may be degenerative, with prominent posterior element hypertrophy and possible bulky ligamentous ossification. Consider CT thoracic spine for further evaluation. 2. Mild spinal canal stenosis at T3-T4. 3. No other high-grade spinal canal stenosis. Otherwise mild degenerative changes. These results were called by telephone at the time of interpretation on 04/12/2021 at 1:11 pm to provider MELANIE BELFI , who verbally acknowledged these results. Electronically Signed   By: Wiliam Ke M.D.   On: 04/12/2021 13:11    EKG: Independently reviewed. Sinus, no st elevations  Time spent: 75 minutes  Cristian Wilson A Shermeka Rutt DO [facility]  If 7PM-7AM, please contact night-coverage [email or web address] 04/12/2021, 4:36 PM

## 2021-04-12 NOTE — ED Notes (Signed)
Per Velna Hatchet in [place] lab, Mag will be added on to labs drawn at [phone]

## 2021-04-12 NOTE — ED Provider Notes (Signed)
[facility]-EMERGENCY DEPT Provider Note   CSN: [id number] Arrival date & time: 04/12/21  0932     History Chief Complaint  Patient presents with   Extremity Weakness    Kitai Purdom is a 40 y.o. male.  Patient is a 40 year old male who presents with leg weakness.  He states today he was at [employer] getting some groceries and his right leg gave out.  Then he tried to catch himself with his left leg and it gave out.  He lowered himself to the ground and was unable to get up so EMS was called.  He reports that he has had a 1 week history of bilateral leg weakness.  He says it is in both of his legs and he does feel some numbness to the side of his thighs and his feet at times.  He does state that he is had some pain in his upper back for several months.  Last September he had a stumble on some stairs and landed hard.  He had numbness in his right foot but it went away.  He has not had any other injuries.  He did not have any associated neck pain at that time no associated back pain.  Over the last several months he has had a feeling of tingling/burning pain in his right mid back and up into his right neck.  He says when he bends his head forward he has a shooting pain that goes down to his lower back.  There is no radiation of the pain down his legs.  He does feel some pressure in his lower back.  He does not have any fevers.  No chest pain or shortness of breath.  No abdominal pain.  No vision deficits or speech deficits.  No numbness or weakness to his upper extremities.  No numbness to his face.  He has not been taking anything for the pain.  He has never been evaluated for it.  He is noted to be markedly hypertensive.  He says that when he goes to the dentist he is told that his blood pressure is elevated but has not had it checked out.      Past Medical History:  Diagnosis Date   Obesity    UTI (lower urinary tract infection)     Patient Active Problem List    Diagnosis Date Noted   Spinal stenosis 04/12/2021    History reviewed. No pertinent surgical history.     History reviewed. No pertinent family history.  Social History   Tobacco Use   Smoking status: Never   Smokeless tobacco: Never  Substance Use Topics   Alcohol use: No   Drug use: No    Home Medications Prior to Admission medications   Not on File    Allergies    Patient has no known allergies.  Review of Systems   Review of Systems  Constitutional:  Negative for chills, diaphoresis, fatigue and fever.  HENT:  Negative for congestion, rhinorrhea and sneezing.   Eyes: Negative.   Respiratory:  Negative for cough, chest tightness and shortness of breath.   Cardiovascular:  Negative for chest pain and leg swelling.  Gastrointestinal:  Negative for abdominal pain, blood in stool, diarrhea, nausea and vomiting.  Genitourinary:  Negative for difficulty urinating, flank pain, frequency and hematuria.  Musculoskeletal:  Positive for back pain and neck pain. Negative for arthralgias.  Skin:  Negative for rash.  Neurological:  Positive for weakness and numbness. Negative for  dizziness, speech difficulty and headaches.   Physical Exam Updated Vital Signs BP (!) 180/110   Pulse 80   Temp 98.9 F (37.2 C) (Oral)   Resp 17   Ht 6\' 2"  (1.88 m)   Wt (!) 149.7 kg   SpO2 100%   BMI 42.37 kg/m   Physical Exam Constitutional:      Appearance: He is well-developed.  HENT:     Head: Normocephalic and atraumatic.  Eyes:     Pupils: Pupils are equal, round, and reactive to light.  Cardiovascular:     Rate and Rhythm: Normal rate and regular rhythm.     Heart sounds: Normal heart sounds.  Pulmonary:     Effort: Pulmonary effort is normal. No respiratory distress.     Breath sounds: Normal breath sounds. No wheezing or rales.  Chest:     Chest wall: No tenderness.  Abdominal:     General: Bowel sounds are normal.     Palpations: Abdomen is soft.     Tenderness:  There is no abdominal tenderness. There is no guarding or rebound.  Musculoskeletal:        General: Normal range of motion.     Cervical back: Normal range of motion and neck supple.     Comments: No tenderness along the cervical, thoracic or lumbosacral spine.  He does say that he feels some pressure in his lumbar spine area when I am pushing on his spine.  He does have some tenderness to his right upper back in the mid thoracic region.  Lymphadenopathy:     Cervical: No cervical adenopathy.  Skin:    General: Skin is warm and dry.     Findings: No rash.  Neurological:     Mental Status: He is alert and oriented to person, place, and time.     Cranial Nerves: No cranial nerve deficit.     Comments: Motor 5 out of 5 in the upper extremities bilaterally.  He has 4 out of 5 strength in the left lower extremity and 3 out of 5 strength in the right lower extremity.  He has weakness in lifting his legs.  He also has some weakness in his right foot in extension and flexion of the foot.  He has numbness in both of his feet and the inner aspects, but normal sensation to light touch in the outer aspects.    ED Results / Procedures / Treatments   Labs (all labs ordered are listed, but only abnormal results are displayed) Labs Reviewed  COMPREHENSIVE METABOLIC PANEL - Abnormal; Notable for the following components:      Result Value   Potassium 2.9 (*)    Glucose, Bld 120 (*)    Calcium 8.4 (*)    All other components within normal limits  RESP PANEL BY RT-PCR (FLU A&B, COVID) ARPGX2  CBC WITH DIFFERENTIAL/PLATELET  MAGNESIUM    EKG EKG Interpretation  Date/Time:  Friday April 12 2021 09:25:03 EDT Ventricular Rate:  77 PR Interval:  166 QRS Duration: 100 QT Interval:  405 QTC Calculation: 459 R Axis:   11 Text Interpretation: Sinus rhythm Nonspecific T abnormalities, lateral leads No old tracing to compare Confirmed by [id number] [phone]) on 04/12/2021 9:30:08 AM  Radiology MR  Cervical Spine Wo Contrast  Result Date: 04/12/2021 CLINICAL DATA:  Pain, progressive neurologic deficit, fall from lower extremity weakness, with increasing lower extremity weakness over the week. EXAM: MRI CERVICAL, THORACIC AND LUMBAR SPINE WITHOUT CONTRAST TECHNIQUE: Multiplanar and multiecho  pulse sequences of the cervical spine, to include the craniocervical junction and cervicothoracic junction, and thoracic and lumbar spine, were obtained without intravenous contrast. COMPARISON:  None. FINDINGS: MRI CERVICAL SPINE FINDINGS Alignment: Physiologic. Vertebrae: No fracture, evidence of discitis, or bone lesion. Cord: Normal signal and morphology. Posterior Fossa, vertebral arteries, paraspinal tissues: Negative. Disc levels: C4-C5 mild left neural foraminal narrowing. No spinal canal stenosis. MRI THORACIC SPINE FINDINGS Alignment:  Physiologic. Vertebrae: No acute fracture. Osseous expansion of the vertebral body and posterior elements of T4 (series 20, image 15). Osseous expansion to a lesser extent of the left posterior aspect of the vertebral body and pedicle at T3 (series 20, image 10) and T5 (series 20, image 20) no focal abnormal osseous signal is seen these levels, however. Cord: Focal compression of the spinal cord at the level of T4-T5, with mildly increased T2 signal (series 20, image 15). The spinal cord is otherwise normal in signal and morphology. Paraspinal and other soft tissues: Negative. Disc levels: Severe spinal canal stenosis at T4-T5. Mild spinal canal stenosis at T3-T4. MRI LUMBAR SPINE FINDINGS Segmentation:  Standard. Alignment:  Physiologic. Vertebrae:  No fracture, evidence of discitis, or bone lesion. Conus medullaris and cauda equina: Conus extends to the L2 level. Conus and cauda equina appear normal. Paraspinal and other soft tissues: Negative. Disc levels: Moderate facet arthropathy at L4-L5, which causes mild left neural foraminal narrowing. No other neural foraminal  narrowing. No spinal canal stenosis. IMPRESSION: 1. 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 The osseous expansion is nonspecific but may be degenerative, with prominent posterior element hypertrophy and possible bulky ligamentous ossification. Consider CT thoracic spine for further evaluation. 2. Mild spinal canal stenosis at T3-T4. 3. No other high-grade spinal canal stenosis. Otherwise mild degenerative changes. These results were called by telephone at the time of interpretation on 04/12/2021 at 1:11 pm to provider Cristopher Ciccarelli , who verbally acknowledged these results. Electronically Signed   By: Wiliam Ke M.D.   On: 04/12/2021 13:11   MR THORACIC SPINE WO CONTRAST  Result Date: 04/12/2021 CLINICAL DATA:  Pain, progressive neurologic deficit, fall from lower extremity weakness, with increasing lower extremity weakness over the week. EXAM: MRI CERVICAL, THORACIC AND LUMBAR SPINE WITHOUT CONTRAST TECHNIQUE: Multiplanar and multiecho pulse sequences of the cervical spine, to include the craniocervical junction and cervicothoracic junction, and thoracic and lumbar spine, were obtained without intravenous contrast. COMPARISON:  None. FINDINGS: MRI CERVICAL SPINE FINDINGS Alignment: Physiologic. Vertebrae: No fracture, evidence of discitis, or bone lesion. Cord: Normal signal and morphology. Posterior Fossa, vertebral arteries, paraspinal tissues: Negative. Disc levels: C4-C5 mild left neural foraminal narrowing. No spinal canal stenosis. MRI THORACIC SPINE FINDINGS Alignment:  Physiologic. Vertebrae: No acute fracture. Osseous expansion of the vertebral body and posterior elements of T4 (series 20, image 15). Osseous expansion to a lesser extent of the left posterior aspect of the vertebral body and  pedicle at T3 (series 20, image 10) and T5 (series 20, image 20) no focal abnormal osseous signal is seen these levels, however. Cord: Focal compression of the spinal cord at the level of T4-T5, with mildly increased T2 signal (series 20, image 15). The spinal cord is otherwise normal in signal and morphology. Paraspinal and other soft tissues: Negative. Disc levels: Severe spinal canal stenosis at T4-T5. Mild spinal canal  stenosis at T3-T4. MRI LUMBAR SPINE FINDINGS Segmentation:  Standard. Alignment:  Physiologic. Vertebrae:  No fracture, evidence of discitis, or bone lesion. Conus medullaris and cauda equina: Conus extends to the L2 level. Conus and cauda equina appear normal. Paraspinal and other soft tissues: Negative. Disc levels: Moderate facet arthropathy at L4-L5, which causes mild left neural foraminal narrowing. No other neural foraminal narrowing. No spinal canal stenosis. IMPRESSION: 1. 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 The osseous expansion is nonspecific but may be degenerative, with prominent posterior element hypertrophy and possible bulky ligamentous ossification. Consider CT thoracic spine for further evaluation. 2. Mild spinal canal stenosis at T3-T4. 3. No other high-grade spinal canal stenosis. Otherwise mild degenerative changes. These results were called by telephone at the time of interpretation on 04/12/2021 at 1:11 pm to provider Krystall Kruckenberg , who verbally acknowledged these results. Electronically Signed   By: Wiliam Ke M.D.   On: 04/12/2021 13:11   MR LUMBAR SPINE WO CONTRAST  Result Date: 04/12/2021 CLINICAL DATA:  Pain, progressive neurologic deficit, fall from lower extremity weakness, with increasing lower extremity weakness over the week. EXAM: MRI CERVICAL,  THORACIC AND LUMBAR SPINE WITHOUT CONTRAST TECHNIQUE: Multiplanar and multiecho pulse sequences of the cervical spine, to include the craniocervical junction and cervicothoracic junction, and thoracic and lumbar spine, were obtained without intravenous contrast. COMPARISON:  None. FINDINGS: MRI CERVICAL SPINE FINDINGS Alignment: Physiologic. Vertebrae: No fracture, evidence of discitis, or bone lesion. Cord: Normal signal and morphology. Posterior Fossa, vertebral arteries, paraspinal tissues: Negative. Disc levels: C4-C5 mild left neural foraminal narrowing. No spinal canal stenosis. MRI THORACIC SPINE FINDINGS Alignment:  Physiologic. Vertebrae: No acute fracture. Osseous expansion of the vertebral body and posterior elements of T4 (series 20, image 15). Osseous expansion to a lesser extent of the left posterior aspect of the vertebral body and pedicle at T3 (series 20, image 10) and T5 (series 20, image 20) no focal abnormal osseous signal is seen these levels, however. Cord: Focal compression of the spinal cord at the level of T4-T5, with mildly increased T2 signal (series 20, image 15). The spinal cord is otherwise normal in signal and morphology. Paraspinal and other soft tissues: Negative. Disc levels: Severe spinal canal stenosis at T4-T5. Mild spinal canal stenosis at T3-T4. MRI LUMBAR SPINE FINDINGS Segmentation:  Standard. Alignment:  Physiologic. Vertebrae:  No fracture, evidence of discitis, or bone lesion. Conus medullaris and cauda equina: Conus extends to the L2 level. Conus and cauda equina appear normal. Paraspinal and other soft tissues: Negative. Disc levels: Moderate facet arthropathy at L4-L5, which causes mild left neural foraminal narrowing. No other neural foraminal narrowing. No spinal canal stenosis. IMPRESSION: 1. Osseous expansion of the vertebral body and posterior elements T4, with less extensive expansion of T3 and T5, which causes severe spinal canal stenosis and focal spinal cord  compression at T4-T5 with mildly increased T2 cord signal, likely edema. The osseous expansion is nonspecific but may be degenerative, with prominent posterior element hypertrophy and possible bulky ligamentous ossification. Consider CT thoracic spine for further evaluation. 2. Mild spinal canal stenosis at T3-T4. 3. No other high-grade spinal canal stenosis. Otherwise mild degenerative changes. These results were called by telephone at the time of interpretation on 04/12/2021 at 1:11 pm to provider Shaleena Crusoe , who verbally acknowledged these results.  Electronically Signed   By: Wiliam Ke M.D.   On: 04/12/2021 13:11    Procedures Procedures   Medications Ordered in ED Medications  dexamethasone (DECADRON) injection 6 mg (6 mg Intravenous Given 04/12/21 1407)  potassium chloride 10 mEq in 100 mL IVPB (0 mEq Intravenous Stopped 04/12/21 1434)  potassium chloride SA (KLOR-CON) CR tablet 40 mEq (40 mEq Oral Given 04/12/21 1035)  metoprolol tartrate (LOPRESSOR) injection 5 mg (5 mg Intravenous Given 04/12/21 1331)    ED Course  I have reviewed the triage vital signs and the nursing notes.  Pertinent labs & imaging results that were available during my care of the patient were reviewed by me and considered in my medical decision making (see chart for details).    MDM Rules/Calculators/A&P                           Patient is a 40 year old male who presents with bilateral leg weakness, right greater than left with some associated mid back pain.  He had MRI of his cervical, thoracic and lumbosacral spine.  This shows evidence of severe spinal stenosis with impingement on the spinal canal.  I spoke with Dr. Lovell Sheehan with neurosurgery who will admit the patient.  He recommends Decadron 6 mg 3 times a day.  He will plan on doing surgery next week.  I also spoke with Dr. Ronaldo Miyamoto who will consult on the patient for blood pressure management.  He was given dose of Lopressor in the ED.  He is not  symptomatic with his blood pressure.  His labs are nonconcerning, other than his potassium is low at 2.9.  He was given potassium replacement. Final Clinical Impression(s) / ED Diagnoses Final diagnoses:  Spinal stenosis of thoracic region  Hypertension, unspecified type  Hypokalemia    Rx / DC Orders ED Discharge Orders     None        Rolan Bucco, MD 04/12/21 1520

## 2021-04-12 NOTE — ED Triage Notes (Signed)
Pt BIB [facility] from [employer]. Pt states he was pushing the grocery cart when his right leg when weak followed by his left leg. Patient was able to lower himself down to the ground. Patient noticed extremity weakness 1 week ago along with numbness and tingling down the back and into the legs. Hx of hypertension, does not take blood thinners.  150  BP palpated  90 HR 98% RA 18 RR 112 CBG

## 2021-04-13 DIAGNOSIS — M4804 Spinal stenosis, thoracic region: Principal | ICD-10-CM

## 2021-04-13 LAB — COMPREHENSIVE METABOLIC PANEL
ALT: 27 U/L (ref 0–44)
AST: 22 U/L (ref 15–41)
Albumin: 3.7 g/dL (ref 3.5–5.0)
Alkaline Phosphatase: 81 U/L (ref 38–126)
Anion gap: 9 (ref 5–15)
BUN: 10 mg/dL (ref 6–20)
CO2: 24 mmol/L (ref 22–32)
Calcium: 8.9 mg/dL (ref 8.9–10.3)
Chloride: 104 mmol/L (ref 98–111)
Creatinine, Ser: 1.1 mg/dL (ref 0.61–1.24)
GFR, Estimated: >60 mL/min (ref >60)
Glucose, Bld: 131 mg/dL — ABNORMAL HIGH (ref 70–99)
Potassium: 3.5 mmol/L (ref 3.5–5.1)
Sodium: 137 mmol/L (ref 135–145)
Total Bilirubin: 0.7 mg/dL (ref 0.3–1.2)
Total Protein: 7.2 g/dL (ref 6.5–8.1)

## 2021-04-13 LAB — CBC
HCT: 41.9 % (ref 39.0–52.0)
Hemoglobin: 13.4 g/dL (ref 13.0–17.0)
MCH: 27.4 pg (ref 26.0–34.0)
MCHC: 32 g/dL (ref 30.0–36.0)
MCV: 85.7 fL (ref 80.0–100.0)
Platelets: 332 10*3/uL (ref 150–400)
RBC: 4.89 MIL/uL (ref 4.22–5.81)
RDW: 13.1 % (ref 11.5–15.5)
WBC: 9.8 10*3/uL (ref 4.0–10.5)
nRBC: 0 % (ref 0.0–0.2)

## 2021-04-13 LAB — HIV ANTIBODY (ROUTINE TESTING W REFLEX): HIV Screen 4th Generation wRfx: NONREACTIVE

## 2021-04-13 MED ORDER — AMLODIPINE BESYLATE 10 MG PO TABS
10.0000 mg | ORAL_TABLET | Freq: Every day | ORAL | Status: DC
  Administered 2021-04-13 – 2021-04-16 (×4): 10 mg via ORAL
  Filled 2021-04-13 (×4): qty 1

## 2021-04-13 NOTE — Progress Notes (Signed)
Patient ID: Cristian Wilson, male   DOB: 1981-05-19, 40 y.o.   MRN: [id number] Subjective: The patient is alert and pleasant.  His legs are much stronger.  He is in no apparent distress.  Objective: Vital signs in last 24 hours: Temp:  [97.2 F (36.2 C)-98.8 F (37.1 C)] 97.2 F (36.2 C) (10/15 0736) Pulse Rate:  [70-93] 80 (10/15 0736) Resp:  [11-21] 16 (10/15 0736) BP: (141-205)/(91-129) 150/91 (10/15 0736) SpO2:  [96 %-100 %] 100 % (10/15 0736) Weight:  [156.8 kg] 156.8 kg (10/14 1908) Estimated body mass index is 44.38 kg/m as calculated from the following:   Height as of this encounter: 6\' 2"  (1.88 m).   Weight as of this encounter: 156.8 kg.   Intake/Output from previous day: 10/14 0701 - 10/15 0700 In: 480 [P.O.:480] Out: 750 [Urine:750] Intake/Output this shift: Total I/O In: -  Out: 400 [Urine:400]  Physical exam the patient is alert and oriented x3.  He has slight weakness in his right iliopsoas and quadricep.  Otherwise his strength seems fairly normal in his lower extremities.  Lab Results: Recent Labs    04/12/21 0916 04/13/21 0226  WBC 8.0 9.8  HGB 13.7 13.4  HCT 42.9 41.9  PLT 333 332   BMET Recent Labs    04/12/21 0916 04/13/21 0226  NA 138 137  K 2.9* 3.5  CL 101 104  CO2 28 24  GLUCOSE 120* 131*  BUN 10 10  CREATININE 1.23 1.10  CALCIUM 8.4* 8.9    Studies/Results: MR Cervical Spine Wo Contrast  Result Date: 04/12/2021 CLINICAL DATA:  Pain, progressive neurologic deficit, fall from lower extremity weakness, with increasing lower extremity weakness over the week. EXAM: MRI CERVICAL, THORACIC AND LUMBAR SPINE WITHOUT CONTRAST TECHNIQUE: Multiplanar and multiecho pulse sequences of the cervical spine, to include the craniocervical junction and cervicothoracic junction, and thoracic and lumbar spine, were obtained without intravenous contrast. COMPARISON:  None. FINDINGS: MRI CERVICAL SPINE FINDINGS Alignment: Physiologic. Vertebrae: No  fracture, evidence of discitis, or bone lesion. Cord: Normal signal and morphology. Posterior Fossa, vertebral arteries, paraspinal tissues: Negative. Disc levels: C4-C5 mild left neural foraminal narrowing. No spinal canal stenosis. MRI THORACIC SPINE FINDINGS Alignment:  Physiologic. Vertebrae: No acute fracture. Osseous expansion of the vertebral body and posterior elements of T4 (series 20, image 15). Osseous expansion to a lesser extent of the left posterior aspect of the vertebral body and pedicle at T3 (series 20, image 10) and T5 (series 20, image 20) no focal abnormal osseous signal is seen these levels, however. Cord: Focal compression of the spinal cord at the level of T4-T5, with mildly increased T2 signal (series 20, image 15). The spinal cord is otherwise normal in signal and morphology. Paraspinal and other soft tissues: Negative. Disc levels: Severe spinal canal stenosis at T4-T5. Mild spinal canal stenosis at T3-T4. MRI LUMBAR SPINE FINDINGS Segmentation:  Standard. Alignment:  Physiologic. Vertebrae:  No fracture, evidence of discitis, or bone lesion. Conus medullaris and cauda equina: Conus extends to the L2 level. Conus and cauda equina appear normal. Paraspinal and other soft tissues: Negative. Disc levels: Moderate facet arthropathy at L4-L5, which causes mild left neural foraminal narrowing. No other neural foraminal narrowing. No spinal canal stenosis. IMPRESSION: 1. 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 The osseous expansion is nonspecific but  may be degenerative, with prominent posterior element hypertrophy and possible bulky ligamentous ossification. Consider CT thoracic spine for further evaluation. 2. Mild spinal canal stenosis at T3-T4. 3. No other high-grade spinal canal  stenosis. Otherwise mild degenerative changes. These results were called by telephone at the time of interpretation on 04/12/2021 at 1:11 pm to provider MELANIE BELFI , who verbally acknowledged these results. Electronically Signed   By: Wiliam Ke M.D.   On: 04/12/2021 13:11   MR THORACIC SPINE WO CONTRAST  Result Date: 04/12/2021 CLINICAL DATA:  Pain, progressive neurologic deficit, fall from lower extremity weakness, with increasing lower extremity weakness over the week. EXAM: MRI CERVICAL, THORACIC AND LUMBAR SPINE WITHOUT CONTRAST TECHNIQUE: Multiplanar and multiecho pulse sequences of the cervical spine, to include the craniocervical junction and cervicothoracic junction, and thoracic and lumbar spine, were obtained without intravenous contrast. COMPARISON:  None. FINDINGS: MRI CERVICAL SPINE FINDINGS Alignment: Physiologic. Vertebrae: No fracture, evidence of discitis, or bone lesion. Cord: Normal signal and morphology. Posterior Fossa, vertebral arteries, paraspinal tissues: Negative. Disc levels: C4-C5 mild left neural foraminal narrowing. No spinal canal stenosis. MRI THORACIC SPINE FINDINGS Alignment:  Physiologic. Vertebrae: No acute fracture. Osseous expansion of the vertebral body and posterior elements of T4 (series 20, image 15). Osseous expansion to a lesser extent of the left posterior aspect of the vertebral body and pedicle at T3 (series 20, image 10) and T5 (series 20, image 20) no focal abnormal osseous signal is seen these levels, however. Cord: Focal compression of the spinal cord at the level of T4-T5, with mildly increased T2 signal (series 20, image 15). The spinal cord is otherwise normal in signal and morphology. Paraspinal and other soft tissues: Negative. Disc levels: Severe spinal canal stenosis at T4-T5. Mild spinal canal stenosis at T3-T4. MRI LUMBAR SPINE FINDINGS Segmentation:  Standard. Alignment:  Physiologic. Vertebrae:  No fracture, evidence of discitis, or bone  lesion. Conus medullaris and cauda equina: Conus extends to the L2 level. Conus and cauda equina appear normal. Paraspinal and other soft tissues: Negative. Disc levels: Moderate facet arthropathy at L4-L5, which causes mild left neural foraminal narrowing. No other neural foraminal narrowing. No spinal canal stenosis. IMPRESSION: 1. 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 The osseous expansion is nonspecific but may be degenerative, with prominent posterior element hypertrophy and possible bulky ligamentous ossification. Consider CT thoracic spine for further evaluation. 2. Mild spinal canal stenosis at T3-T4. 3. No other high-grade spinal canal stenosis. Otherwise mild degenerative changes. These results were called by telephone at the time of interpretation on 04/12/2021 at 1:11 pm to provider MELANIE BELFI , who verbally acknowledged these results. Electronically Signed   By: Wiliam Ke M.D.   On: 04/12/2021 13:11   MR LUMBAR SPINE WO CONTRAST  Result Date: 04/12/2021 CLINICAL DATA:  Pain, progressive neurologic deficit, fall from lower extremity weakness, with increasing lower extremity weakness over the week. EXAM: MRI CERVICAL, THORACIC AND LUMBAR SPINE WITHOUT CONTRAST TECHNIQUE: Multiplanar and multiecho pulse sequences of the cervical spine, to include the craniocervical junction and cervicothoracic junction, and thoracic and lumbar spine, were obtained without intravenous contrast. COMPARISON:  None. FINDINGS: MRI CERVICAL SPINE FINDINGS Alignment: Physiologic. Vertebrae: No fracture, evidence of discitis, or bone lesion. Cord: Normal signal and morphology. Posterior Fossa, vertebral arteries, paraspinal tissues: Negative. Disc levels: C4-C5  mild left neural foraminal narrowing. No spinal  canal stenosis. MRI THORACIC SPINE FINDINGS Alignment:  Physiologic. Vertebrae: No acute fracture. Osseous expansion of the vertebral body and posterior elements of T4 (series 20, image 15). Osseous expansion to a lesser extent of the left posterior aspect of the vertebral body and pedicle at T3 (series 20, image 10) and T5 (series 20, image 20) no focal abnormal osseous signal is seen these levels, however. Cord: Focal compression of the spinal cord at the level of T4-T5, with mildly increased T2 signal (series 20, image 15). The spinal cord is otherwise normal in signal and morphology. Paraspinal and other soft tissues: Negative. Disc levels: Severe spinal canal stenosis at T4-T5. Mild spinal canal stenosis at T3-T4. MRI LUMBAR SPINE FINDINGS Segmentation:  Standard. Alignment:  Physiologic. Vertebrae:  No fracture, evidence of discitis, or bone lesion. Conus medullaris and cauda equina: Conus extends to the L2 level. Conus and cauda equina appear normal. Paraspinal and other soft tissues: Negative. Disc levels: Moderate facet arthropathy at L4-L5, which causes mild left neural foraminal narrowing. No other neural foraminal narrowing. No spinal canal stenosis. IMPRESSION: 1. 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 The osseous expansion is nonspecific but may be degenerative, with prominent posterior element hypertrophy and possible bulky ligamentous ossification. Consider CT thoracic spine for further evaluation. 2. Mild spinal canal stenosis at T3-T4. 3. No other high-grade spinal canal stenosis. Otherwise mild degenerative changes. These results were called by telephone at the time of interpretation on 04/12/2021 at 1:11 pm to provider MELANIE BELFI , who verbally acknowledged these results. Electronically  Signed   By: Wiliam Ke M.D.   On: 04/12/2021 13:11    Assessment/Plan: T4 stenosis, thoracic myelopathy, paraparesis, thoracic spine pain: I have again discussed the situation with the patient.  I recommend a T4-5 laminectomy to decompress the spinal cord.  I described the surgery to him.  We have discussed the risk of surgical including risk of anesthesia, infection, spinal fluid leak, spinal cord injury, paralysis, medical risk, etc.  We discussed the expected postoperative course and the likelihood of achieving our goals with surgery.  I have answered all his questions.  He wants to proceed with surgery as scheduled on Monday.  LOS: 1 day     Cristi Loron 04/13/2021, 8:55 AM

## 2021-04-13 NOTE — Progress Notes (Signed)
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 no prior illnesses Noted paresthesias tingling and lower extremity weakness over the past several weeks after back pain in February after lifting something heavy ultimately fell right leg gave out on 10/14-seen by neurosurgery has found to have severe spinal stenosis T4-T5 and planning for surgery Medical consult requested secondary to medical management and new hypertension  Hospital-Problem based course  Thoracic stenosis myelopathy and paresthesias Defer to neurosurgery-plan for surgery with laminectomy T4-T5 probably on Monday Continue steroids and pain meds per them Hypertensive urgency-blood pressures ranging from 140s to 160 Likely related to pain as a component of this Controlled with losartan 50 daily Add amlodipine 10 Continue as needed hydralazine every 8 as needed and Lopressor if needed Severe hypokalemia on admission Etiology unclear-replace with oral potassium 40 daily Recheck a.m. OHSS habitus, BMI 44 Patient tells me he sNores--May require outpatient sleep apnea testing  DVT prophylaxis: SCD Code Status: Full Family Communication: None present Disposition:  Status is: Inpatient  Remains inpatient appropriate because: Unstable stable for discharge       Consultants:    Procedures:   Antimicrobials:     Subjective: Awake pleasant coherent alert no distress able to move all 4 limbs feels overall better no chest pain  Objective: Vitals:   04/12/21 1700 04/12/21 1908 04/12/21 2312 04/13/21 0736  BP: (!) 163/104 (!) 178/105 (!) 141/93 (!) 150/91  Pulse: 82 80 83 80  Resp: 14  20 16   Temp:  98.6 F (37 C) 98.8 F (37.1 C) (!) 97.2 F (36.2 C)  TempSrc:  Oral Oral Oral  SpO2: 98% 98% 96% 100%  Weight:  (!) 156.8 kg    Height:  6\' 2"  (1.88 m)      Intake/Output Summary  (Last 24 hours) at 04/13/2021 0918 Last data filed at 04/13/2021 0808 Gross per 24 hour  Intake 480 ml  Output 1150 ml  Net -670 ml   Filed Weights   04/12/21 0815 04/12/21 0818 04/12/21 1908  Weight: (!) 156.5 kg (!) 149.7 kg (!) 156.8 kg    Examination: EOMI NCAT thick neck Mallampati 4 CTA B no rales no rhonchi no added sounds S1-S2 no murmur ROM intact power 5/5 upper lower extremities sensory grossly intact dorsi plantarflexion intact reflexes deferred gait deferred   Data Reviewed: personally reviewed   CBC    Component Value Date/Time   WBC 9.8 04/13/2021 0226   RBC 4.89 04/13/2021 0226   HGB 13.4 04/13/2021 0226   HCT 41.9 04/13/2021 0226   PLT 332 04/13/2021 0226   MCV 85.7 04/13/2021 0226   MCH 27.4 04/13/2021 0226   MCHC 32.0 04/13/2021 0226   RDW 13.1 04/13/2021 0226   LYMPHSABS 1.8 04/12/2021 0916   MONOABS 0.6 04/12/2021 0916   EOSABS 0.2 04/12/2021 0916   BASOSABS 0.1 04/12/2021 0916   CMP Latest Ref Rng & Units 04/13/2021 04/12/2021  Glucose 70 - 99 mg/dL 04/15/2021) 04/14/2021)  BUN 6 - 20 mg/dL 10 10  Creatinine 505(L - 1.24 mg/dL 976(B 3.41  Sodium 9.37 - 145 mmol/L 137 138  Potassium 3.5 - 5.1 mmol/L 3.5 2.9(L)  Chloride 98 - 111 mmol/L 104 101  CO2 22 - 32 mmol/L 24 28  Calcium 8.9 - 10.3 mg/dL 8.9 9.02)  Total Protein 6.5 - 8.1 g/dL 7.2 7.6  Total Bilirubin 0.3 - 1.2 mg/dL 0.7 0.5  Alkaline Phos 38 - 126 U/L 81 83  AST 15 - 41 U/L 22 24  ALT 0 - 44 U/L 27 29     Radiology Studies: MR Cervical Spine Wo Contrast  Result Date: 04/12/2021 CLINICAL DATA:  Pain, progressive neurologic deficit, fall from lower extremity weakness, with increasing lower extremity weakness over the week. EXAM: MRI CERVICAL, THORACIC AND LUMBAR SPINE WITHOUT CONTRAST TECHNIQUE: Multiplanar and multiecho pulse sequences of the cervical spine, to include the craniocervical junction and cervicothoracic junction, and thoracic and lumbar spine, were obtained without intravenous  contrast. COMPARISON:  None. FINDINGS: MRI CERVICAL SPINE FINDINGS Alignment: Physiologic. Vertebrae: No fracture, evidence of discitis, or bone lesion. Cord: Normal signal and morphology. Posterior Fossa, vertebral arteries, paraspinal tissues: Negative. Disc levels: C4-C5 mild left neural foraminal narrowing. No spinal canal stenosis. MRI THORACIC SPINE FINDINGS Alignment:  Physiologic. Vertebrae: No acute fracture. Osseous expansion of the vertebral body and posterior elements of T4 (series 20, image 15). Osseous expansion to a lesser extent of the left posterior aspect of the vertebral body and pedicle at T3 (series 20, image 10) and T5 (series 20, image 20) no focal abnormal osseous signal is seen these levels, however. Cord: Focal compression of the spinal cord at the level of T4-T5, with mildly increased T2 signal (series 20, image 15). The spinal cord is otherwise normal in signal and morphology. Paraspinal and other soft tissues: Negative. Disc levels: Severe spinal canal stenosis at T4-T5. Mild spinal canal stenosis at T3-T4. MRI LUMBAR SPINE FINDINGS Segmentation:  Standard. Alignment:  Physiologic. Vertebrae:  No fracture, evidence of discitis, or bone lesion. Conus medullaris and cauda equina: Conus extends to the L2 level. Conus and cauda equina appear normal. Paraspinal and other soft tissues: Negative. Disc levels: Moderate facet arthropathy at L4-L5, which causes mild left neural foraminal narrowing. No other neural foraminal narrowing. No spinal canal stenosis. IMPRESSION: 1. 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 The osseous expansion is nonspecific but may be degenerative, with prominent posterior element hypertrophy and possible bulky ligamentous ossification. Consider CT thoracic spine for  further evaluation. 2. Mild spinal canal stenosis at T3-T4. 3. No other high-grade spinal canal stenosis. Otherwise mild degenerative changes. These results were called by telephone at the time of interpretation on 04/12/2021 at 1:11 pm to provider MELANIE BELFI , who verbally acknowledged these results. Electronically Signed   By: Wiliam Ke M.D.   On: 04/12/2021 13:11   MR THORACIC SPINE WO CONTRAST  Result Date: 04/12/2021 CLINICAL DATA:  Pain, progressive neurologic deficit, fall from lower extremity weakness, with increasing lower extremity weakness over the week. EXAM: MRI CERVICAL, THORACIC AND LUMBAR SPINE WITHOUT CONTRAST TECHNIQUE: Multiplanar and multiecho pulse sequences of the cervical spine, to include the craniocervical junction and cervicothoracic junction, and thoracic and lumbar spine, were obtained without intravenous contrast. COMPARISON:  None. FINDINGS: MRI CERVICAL SPINE FINDINGS Alignment: Physiologic. Vertebrae: No fracture, evidence of discitis, or bone lesion. Cord: Normal signal and morphology. Posterior Fossa, vertebral arteries, paraspinal tissues: Negative. Disc levels: C4-C5 mild left neural foraminal narrowing. No spinal canal stenosis. MRI THORACIC SPINE FINDINGS Alignment:  Physiologic. Vertebrae: No acute fracture. Osseous expansion of the vertebral body and posterior elements of T4 (series 20, image 15). Osseous expansion to a lesser extent of the left posterior aspect of the vertebral body  and pedicle at T3 (series 20, image 10) and T5 (series 20, image 20) no focal abnormal osseous signal is seen these levels, however. Cord: Focal compression of the spinal cord at the level of T4-T5, with mildly increased T2 signal (series 20, image 15). The spinal cord is otherwise normal in signal and morphology. Paraspinal and other soft tissues: Negative. Disc levels: Severe spinal canal stenosis at T4-T5. Mild spinal canal stenosis at T3-T4. MRI LUMBAR SPINE FINDINGS Segmentation:   Standard. Alignment:  Physiologic. Vertebrae:  No fracture, evidence of discitis, or bone lesion. Conus medullaris and cauda equina: Conus extends to the L2 level. Conus and cauda equina appear normal. Paraspinal and other soft tissues: Negative. Disc levels: Moderate facet arthropathy at L4-L5, which causes mild left neural foraminal narrowing. No other neural foraminal narrowing. No spinal canal stenosis. IMPRESSION: 1. 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 The osseous expansion is nonspecific but may be degenerative, with prominent posterior element hypertrophy and possible bulky ligamentous ossification. Consider CT thoracic spine for further evaluation. 2. Mild spinal canal stenosis at T3-T4. 3. No other high-grade spinal canal stenosis. Otherwise mild degenerative changes. These results were called by telephone at the time of interpretation on 04/12/2021 at 1:11 pm to provider MELANIE BELFI , who verbally acknowledged these results. Electronically Signed   By: Wiliam Ke M.D.   On: 04/12/2021 13:11   MR LUMBAR SPINE WO CONTRAST  Result Date: 04/12/2021 CLINICAL DATA:  Pain, progressive neurologic deficit, fall from lower extremity weakness, with increasing lower extremity weakness over the week. EXAM: MRI CERVICAL, THORACIC AND LUMBAR SPINE WITHOUT CONTRAST TECHNIQUE: Multiplanar and multiecho pulse sequences of the cervical spine, to include the craniocervical junction and cervicothoracic junction, and thoracic and lumbar spine, were obtained without intravenous contrast. COMPARISON:  None. FINDINGS: MRI CERVICAL SPINE FINDINGS Alignment: Physiologic. Vertebrae: No fracture, evidence of discitis, or bone lesion. Cord: Normal signal and morphology. Posterior Fossa, vertebral arteries, paraspinal  tissues: Negative. Disc levels: C4-C5 mild left neural foraminal narrowing. No spinal canal stenosis. MRI THORACIC SPINE FINDINGS Alignment:  Physiologic. Vertebrae: No acute fracture. Osseous expansion of the vertebral body and posterior elements of T4 (series 20, image 15). Osseous expansion to a lesser extent of the left posterior aspect of the vertebral body and pedicle at T3 (series 20, image 10) and T5 (series 20, image 20) no focal abnormal osseous signal is seen these levels, however. Cord: Focal compression of the spinal cord at the level of T4-T5, with mildly increased T2 signal (series 20, image 15). The spinal cord is otherwise normal in signal and morphology. Paraspinal and other soft tissues: Negative. Disc levels: Severe spinal canal stenosis at T4-T5. Mild spinal canal stenosis at T3-T4. MRI LUMBAR SPINE FINDINGS Segmentation:  Standard. Alignment:  Physiologic. Vertebrae:  No fracture, evidence of discitis, or bone lesion. Conus medullaris and cauda equina: Conus extends to the L2 level. Conus and cauda equina appear normal. Paraspinal and other soft tissues: Negative. Disc levels: Moderate facet arthropathy at L4-L5, which causes mild left neural foraminal narrowing. No other neural foraminal narrowing. No spinal canal stenosis. IMPRESSION: 1. Osseous expansion of the vertebral body and posterior elements T4, with less extensive expansion of T3 and T5, which causes severe spinal canal stenosis and focal spinal cord compression at T4-T5 with mildly increased T2 cord signal,  likely edema. The osseous expansion is nonspecific but may be degenerative, with prominent posterior element hypertrophy and possible bulky ligamentous ossification. Consider CT thoracic spine for further evaluation. 2. Mild spinal canal stenosis at T3-T4. 3. No other high-grade spinal canal stenosis. Otherwise mild degenerative changes. These results were called by telephone at the time of interpretation on 04/12/2021 at 1:11 pm  to provider MELANIE BELFI , who verbally acknowledged these results. Electronically Signed   By: Wiliam Ke M.D.   On: 04/12/2021 13:11     Scheduled Meds:  dexamethasone (DECADRON) injection  6 mg Intravenous Q8H   hydrocerin   Topical BID   losartan  50 mg Oral Daily   potassium chloride  40 mEq Oral Daily   Continuous Infusions:   LOS: 1 day   Time spent: 63  Rhetta Mura, MD [facility] To contact the attending provider between 7A-7P or the covering provider during after hours 7P-7A, please log into the web site [email or web address] and access using universal [place] password for that web site. If you do not have the password, please call the hospital operator.  04/13/2021, 9:18 AM

## 2021-04-14 LAB — RENAL FUNCTION PANEL
Albumin: 3.5 g/dL (ref 3.5–5.0)
Anion gap: 8 (ref 5–15)
BUN: 16 mg/dL (ref 6–20)
CO2: 25 mmol/L (ref 22–32)
Calcium: 9.2 mg/dL (ref 8.9–10.3)
Chloride: 105 mmol/L (ref 98–111)
Creatinine, Ser: 1.16 mg/dL (ref 0.61–1.24)
GFR, Estimated: >60 mL/min (ref >60)
Glucose, Bld: 163 mg/dL — ABNORMAL HIGH (ref 70–99)
Phosphorus: 2.1 mg/dL — ABNORMAL LOW (ref 2.5–4.6)
Potassium: 3.9 mmol/L (ref 3.5–5.1)
Sodium: 138 mmol/L (ref 135–145)

## 2021-04-14 LAB — CBC WITH DIFFERENTIAL/PLATELET
Abs Immature Granulocytes: 0.07 10*3/uL (ref 0.00–0.07)
Basophils Absolute: 0 10*3/uL (ref 0.0–0.1)
Basophils Relative: 0 %
Eosinophils Absolute: 0 10*3/uL (ref 0.0–0.5)
Eosinophils Relative: 0 %
HCT: 43.1 % (ref 39.0–52.0)
Hemoglobin: 13.7 g/dL (ref 13.0–17.0)
Immature Granulocytes: 1 %
Lymphocytes Relative: 8 %
Lymphs Abs: 1.1 10*3/uL (ref 0.7–4.0)
MCH: 27.4 pg (ref 26.0–34.0)
MCHC: 31.8 g/dL (ref 30.0–36.0)
MCV: 86.2 fL (ref 80.0–100.0)
Monocytes Absolute: 0.6 10*3/uL (ref 0.1–1.0)
Monocytes Relative: 4 %
Neutro Abs: 12.6 10*3/uL — ABNORMAL HIGH (ref 1.7–7.7)
Neutrophils Relative %: 87 %
Platelets: 361 10*3/uL (ref 150–400)
RBC: 5 MIL/uL (ref 4.22–5.81)
RDW: 13.2 % (ref 11.5–15.5)
WBC: 14.4 10*3/uL — ABNORMAL HIGH (ref 4.0–10.5)
nRBC: 0 % (ref 0.0–0.2)

## 2021-04-14 NOTE — Progress Notes (Signed)
Chest Consult follow-up We will see again briefly tomorrow-perioperatively-I will follow postop trends on Tuesday and if stable we will sign off   Thoracic stenosis myelopathy and paresthesias Defer to neurosurgery-plan for surgery with laminectomy T4-T5 probably on Monday Continue steroids and pain meds per them Leukocytosis 2/2 steroids--no infectious features noted on exam Hypertensive urgency-blood pressures ranging from 140s to 160 on admit Likely related to pain as a component of this Controlled with losartan 50 daily Added amlodipine 10 Continue as needed hydralazine every 8 as needed  Blood pressures are predominantly controlled below 140--follow post-op trends Severe hypokalemia on admission Etiology unclear-resolved--stop replacement OHSS habitus, BMI 44 Patient tells me he sNores--May require outpatient sleep apnea testing  Cristian Wilson  CWU:[id number] DOB: [birth year] DOA: 04/12/2021 PCP: Patient, No Pcp Per (Inactive)  Brief Narrative:  40 year old male 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 no prior illnesses Noted paresthesias tingling and lower extremity weakness over the past several weeks after back pain in February after lifting something heavy ultimately fell right leg gave out on 10/14-seen by neurosurgery has found to have severe spinal stenosis T4-T5 and planning for surgery Medical consult requested secondary to medical management and new hypertension   DVT prophylaxis: SCD Code Status: Full Family Communication: None present Disposition:  Status is: Inpatient  Remains inpatient appropriate because: Unstable stable for discharge   Subjective:  well ambulatory 7 No pain no fever Can raise his legs and straight leg raise  Objective: Vitals:   04/12/21 2312 04/13/21 0736 04/13/21 1431 04/13/21 2031  BP: (!) 141/93 (!) 150/91 (!) 141/83 (!) 146/87  Pulse: 83 80 90 93  Resp: 20 16 15 16   Temp: 98.8 F (37.1 C) (!) 97.2 F (36.2 C) 97.6 F (36.4 C) 97.9 F  (36.6 C)  TempSrc: Oral Oral Oral Oral  SpO2: 96% 100% 96% 96%  Weight:      Height:        Intake/Output Summary (Last 24 hours) at 04/14/2021 1007 Last data filed at 04/14/2021 0532 Gross per 24 hour  Intake 240 ml  Output 400 ml  Net -160 ml    Filed Weights   04/12/21 0815 04/12/21 0818 04/12/21 1908  Weight: (!) 156.5 kg (!) 149.7 kg (!) 156.8 kg    Examination:  Awake coherent no distress playing video games on computer no chest pain no fever Clear no added sound no rales rhonchi Abdomen soft Able to straight leg raise Has actually been able to walk to the bathroom   Data Reviewed: personally reviewed   CBC    Component Value Date/Time   WBC 14.4 (H) 04/14/2021 0216   RBC 5.00 04/14/2021 0216   HGB 13.7 04/14/2021 0216   HCT 43.1 04/14/2021 0216   PLT 361 04/14/2021 0216   MCV 86.2 04/14/2021 0216   MCH 27.4 04/14/2021 0216   MCHC 31.8 04/14/2021 0216   RDW 13.2 04/14/2021 0216   LYMPHSABS 1.1 04/14/2021 0216   MONOABS 0.6 04/14/2021 0216   EOSABS 0.0 04/14/2021 0216   BASOSABS 0.0 04/14/2021 0216   CMP Latest Ref Rng & Units 04/14/2021 04/13/2021 04/12/2021  Glucose 70 - 99 mg/dL 04/14/2021) 388(E) 280(K)  BUN 6 - 20 mg/dL 16 10 10   Creatinine 0.61 - 1.24 mg/dL 349(Z 7.91  Sodium 135 - 145 mmol/L 138 137 138  Potassium 3.5 - 5.1 mmol/L 3.9 3.5 2.9(L)  Chloride 98 - 111 mmol/L 105 104 101  CO2 22 - 32 mmol/L 25 24 28   Calcium 8.9 - 10.3 mg/dL 9.2  8.9 8.4(L)  Total Protein 6.5 - 8.1 g/dL - 7.2 7.6  Total Bilirubin 0.3 - 1.2 mg/dL - 0.7 0.5  Alkaline Phos 38 - 126 U/L - 81 83  AST 15 - 41 U/L - 22 24  ALT 0 - 44 U/L - 27 29     Radiology Studies: MR Cervical Spine Wo Contrast  Result Date: 04/12/2021 CLINICAL DATA:  Pain, progressive neurologic deficit, fall from lower extremity weakness, with increasing lower extremity weakness over the week. EXAM: MRI CERVICAL, THORACIC AND LUMBAR SPINE WITHOUT CONTRAST TECHNIQUE: Multiplanar and multiecho  pulse sequences of the cervical spine, to include the craniocervical junction and cervicothoracic junction, and thoracic and lumbar spine, were obtained without intravenous contrast. COMPARISON:  None. FINDINGS: MRI CERVICAL SPINE FINDINGS Alignment: Physiologic. Vertebrae: No fracture, evidence of discitis, or bone lesion. Cord: Normal signal and morphology. Posterior Fossa, vertebral arteries, paraspinal tissues: Negative. Disc levels: C4-C5 mild left neural foraminal narrowing. No spinal canal stenosis. MRI THORACIC SPINE FINDINGS Alignment:  Physiologic. Vertebrae: No acute fracture. Osseous expansion of the vertebral body and posterior elements of T4 (series 20, image 15). Osseous expansion to a lesser extent of the left posterior aspect of the vertebral body and pedicle at T3 (series 20, image 10) and T5 (series 20, image 20) no focal abnormal osseous signal is seen these levels, however. Cord: Focal compression of the spinal cord at the level of T4-T5, with mildly increased T2 signal (series 20, image 15). The spinal cord is otherwise normal in signal and morphology. Paraspinal and other soft tissues: Negative. Disc levels: Severe spinal canal stenosis at T4-T5. Mild spinal canal stenosis at T3-T4. MRI LUMBAR SPINE FINDINGS Segmentation:  Standard. Alignment:  Physiologic. Vertebrae:  No fracture, evidence of discitis, or bone lesion. Conus medullaris and cauda equina: Conus extends to the L2 level. Conus and cauda equina appear normal. Paraspinal and other soft tissues: Negative. Disc levels: Moderate facet arthropathy at L4-L5, which causes mild left neural foraminal narrowing. No other neural foraminal narrowing. No spinal canal stenosis. IMPRESSION: 1. 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 The  osseous expansion is nonspecific but may be degenerative, with prominent posterior element hypertrophy and possible bulky ligamentous ossification. Consider CT thoracic spine for further evaluation. 2. Mild spinal canal stenosis at T3-T4. 3. No other high-grade spinal canal stenosis. Otherwise mild degenerative changes. These results were called by telephone at the time of interpretation on 04/12/2021 at 1:11 pm to provider MELANIE BELFI , who verbally acknowledged these results. Electronically Signed   By: Wiliam Ke M.D.   On: 04/12/2021 13:11   MR THORACIC SPINE WO CONTRAST  Result Date: 04/12/2021 CLINICAL DATA:  Pain, progressive neurologic deficit, fall from lower extremity weakness, with increasing lower extremity weakness over the week. EXAM: MRI CERVICAL, THORACIC AND LUMBAR SPINE WITHOUT CONTRAST TECHNIQUE: Multiplanar and multiecho pulse sequences of the cervical spine, to include the craniocervical junction and cervicothoracic junction, and thoracic and lumbar spine, were obtained without intravenous contrast. COMPARISON:  None. FINDINGS: MRI CERVICAL SPINE FINDINGS Alignment: Physiologic. Vertebrae: No fracture, evidence of discitis, or bone lesion. Cord: Normal signal and morphology. Posterior Fossa, vertebral arteries, paraspinal tissues: Negative. Disc levels: C4-C5 mild left neural foraminal narrowing. No spinal canal stenosis. MRI THORACIC SPINE FINDINGS Alignment:  Physiologic. Vertebrae: No acute fracture. Osseous expansion of the  vertebral body and posterior elements of T4 (series 20, image 15). Osseous expansion to a lesser extent of the left posterior aspect of the vertebral body and pedicle at T3 (series 20, image 10) and T5 (series 20, image 20) no focal abnormal osseous signal is seen these levels, however. Cord: Focal compression of the spinal cord at the level of T4-T5, with mildly increased T2 signal (series 20, image 15). The spinal cord is otherwise normal in signal and  morphology. Paraspinal and other soft tissues: Negative. Disc levels: Severe spinal canal stenosis at T4-T5. Mild spinal canal stenosis at T3-T4. MRI LUMBAR SPINE FINDINGS Segmentation:  Standard. Alignment:  Physiologic. Vertebrae:  No fracture, evidence of discitis, or bone lesion. Conus medullaris and cauda equina: Conus extends to the L2 level. Conus and cauda equina appear normal. Paraspinal and other soft tissues: Negative. Disc levels: Moderate facet arthropathy at L4-L5, which causes mild left neural foraminal narrowing. No other neural foraminal narrowing. No spinal canal stenosis. IMPRESSION: 1. 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 The osseous expansion is nonspecific but may be degenerative, with prominent posterior element hypertrophy and possible bulky ligamentous ossification. Consider CT thoracic spine for further evaluation. 2. Mild spinal canal stenosis at T3-T4. 3. No other high-grade spinal canal stenosis. Otherwise mild degenerative changes. These results were called by telephone at the time of interpretation on 04/12/2021 at 1:11 pm to provider MELANIE BELFI , who verbally acknowledged these results. Electronically Signed   By: Wiliam Ke M.D.   On: 04/12/2021 13:11   MR LUMBAR SPINE WO CONTRAST  Result Date: 04/12/2021 CLINICAL DATA:  Pain, progressive neurologic deficit, fall from lower extremity weakness, with increasing lower extremity weakness over the week. EXAM: MRI CERVICAL, THORACIC AND LUMBAR SPINE WITHOUT CONTRAST TECHNIQUE: Multiplanar and multiecho pulse sequences of the cervical spine, to include the craniocervical junction and cervicothoracic junction, and thoracic and lumbar spine, were obtained without intravenous contrast. COMPARISON:  None. FINDINGS: MRI  CERVICAL SPINE FINDINGS Alignment: Physiologic. Vertebrae: No fracture, evidence of discitis, or bone lesion. Cord: Normal signal and morphology. Posterior Fossa, vertebral arteries, paraspinal tissues: Negative. Disc levels: C4-C5 mild left neural foraminal narrowing. No spinal canal stenosis. MRI THORACIC SPINE FINDINGS Alignment:  Physiologic. Vertebrae: No acute fracture. Osseous expansion of the vertebral body and posterior elements of T4 (series 20, image 15). Osseous expansion to a lesser extent of the left posterior aspect of the vertebral body and pedicle at T3 (series 20, image 10) and T5 (series 20, image 20) no focal abnormal osseous signal is seen these levels, however. Cord: Focal compression of the spinal cord at the level of T4-T5, with mildly increased T2 signal (series 20, image 15). The spinal cord is otherwise normal in signal and morphology. Paraspinal and other soft tissues: Negative. Disc levels: Severe spinal canal stenosis at T4-T5. Mild spinal canal stenosis at T3-T4. MRI LUMBAR SPINE FINDINGS Segmentation:  Standard. Alignment:  Physiologic. Vertebrae:  No fracture, evidence of discitis, or bone lesion. Conus medullaris and cauda equina: Conus extends to the L2 level. Conus and cauda equina appear normal. Paraspinal and other soft tissues: Negative. Disc levels: Moderate facet arthropathy at L4-L5, which causes mild left neural foraminal narrowing. No other neural foraminal narrowing. No spinal canal stenosis. IMPRESSION: 1. Osseous expansion of the vertebral body and posterior elements T4, with  less extensive expansion of T3 and T5, which causes severe spinal canal stenosis and focal spinal cord compression at T4-T5 with mildly increased T2 cord signal, likely edema. The osseous expansion is nonspecific but may be degenerative, with prominent posterior element hypertrophy and possible bulky ligamentous ossification. Consider CT thoracic spine for further evaluation. 2. Mild spinal canal  stenosis at T3-T4. 3. No other high-grade spinal canal stenosis. Otherwise mild degenerative changes. These results were called by telephone at the time of interpretation on 04/12/2021 at 1:11 pm to provider MELANIE BELFI , who verbally acknowledged these results. Electronically Signed   By: Wiliam Ke M.D.   On: 04/12/2021 13:11     Scheduled Meds:  amLODipine  10 mg Oral Daily   dexamethasone (DECADRON) injection  6 mg Intravenous Q8H   hydrocerin   Topical BID   losartan  50 mg Oral Daily   potassium chloride  40 mEq Oral Daily   Continuous Infusions:   LOS: 2 days   Time spent: 20  Rhetta Mura, MD [facility] To contact the attending provider between 7A-7P or the covering provider during after hours 7P-7A, please log into the web site [email or web address] and access using universal [place] password for that web site. If you do not have the password, please call the hospital operator.  04/14/2021, 10:07 AM

## 2021-04-14 NOTE — Plan of Care (Signed)

## 2021-04-14 NOTE — Progress Notes (Signed)
Subjective: The patient is alert and pleasant.  He is in no apparent distress.  Objective: Vital signs in last 24 hours: Temp:  [97.6 F (36.4 C)-97.9 F (36.6 C)] 97.9 F (36.6 C) (10/15 2031) Pulse Rate:  [90-93] 93 (10/15 2031) Resp:  [15-16] 16 (10/15 2031) BP: (141-146)/(83-87) 146/87 (10/15 2031) SpO2:  [96 %] 96 % (10/15 2031) Estimated body mass index is 44.38 kg/m as calculated from the following:   Height as of this encounter: 6\' 2"  (1.88 m).   Weight as of this encounter: 156.8 kg.   Intake/Output from previous day: 10/15 0701 - 10/16 0700 In: 720 [P.O.:720] Out: 800 [Urine:800] Intake/Output this shift: No intake/output data recorded.  Physical exam the patient is alert and oriented.  He has some slight weakness in his proximal left leg.  Lab Results: Recent Labs    04/13/21 0226 04/14/21 0216  WBC 9.8 14.4*  HGB 13.4 13.7  HCT 41.9 43.1  PLT 332 361   BMET Recent Labs    04/13/21 0226 04/14/21 0216  NA 137 138  K 3.5 3.9  CL 104 105  CO2 24 25  GLUCOSE 131* 163*  BUN 10 16  CREATININE 1.10 1.16  CALCIUM 8.9 9.2    Studies/Results: MR Cervical Spine Wo Contrast  Result Date: 04/12/2021 CLINICAL DATA:  Pain, progressive neurologic deficit, fall from lower extremity weakness, with increasing lower extremity weakness over the week. EXAM: MRI CERVICAL, THORACIC AND LUMBAR SPINE WITHOUT CONTRAST TECHNIQUE: Multiplanar and multiecho pulse sequences of the cervical spine, to include the craniocervical junction and cervicothoracic junction, and thoracic and lumbar spine, were obtained without intravenous contrast. COMPARISON:  None. FINDINGS: MRI CERVICAL SPINE FINDINGS Alignment: Physiologic. Vertebrae: No fracture, evidence of discitis, or bone lesion. Cord: Normal signal and morphology. Posterior Fossa, vertebral arteries, paraspinal tissues: Negative. Disc levels: C4-C5 mild left neural foraminal narrowing. No spinal canal stenosis. MRI THORACIC SPINE  FINDINGS Alignment:  Physiologic. Vertebrae: No acute fracture. Osseous expansion of the vertebral body and posterior elements of T4 (series 20, image 15). Osseous expansion to a lesser extent of the left posterior aspect of the vertebral body and pedicle at T3 (series 20, image 10) and T5 (series 20, image 20) no focal abnormal osseous signal is seen these levels, however. Cord: Focal compression of the spinal cord at the level of T4-T5, with mildly increased T2 signal (series 20, image 15). The spinal cord is otherwise normal in signal and morphology. Paraspinal and other soft tissues: Negative. Disc levels: Severe spinal canal stenosis at T4-T5. Mild spinal canal stenosis at T3-T4. MRI LUMBAR SPINE FINDINGS Segmentation:  Standard. Alignment:  Physiologic. Vertebrae:  No fracture, evidence of discitis, or bone lesion. Conus medullaris and cauda equina: Conus extends to the L2 level. Conus and cauda equina appear normal. Paraspinal and other soft tissues: Negative. Disc levels: Moderate facet arthropathy at L4-L5, which causes mild left neural foraminal narrowing. No other neural foraminal narrowing. No spinal canal stenosis. IMPRESSION: 1. 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 The osseous expansion is nonspecific but may be degenerative, with prominent posterior element hypertrophy and possible bulky ligamentous ossification. Consider CT thoracic spine for further evaluation. 2. Mild spinal canal stenosis at T3-T4. 3. No other high-grade spinal canal stenosis. Otherwise mild degenerative changes. These results were called by telephone at the  time of interpretation on 04/12/2021 at 1:11 pm to provider MELANIE BELFI , who verbally acknowledged these results. Electronically Signed   By: Wiliam Ke M.D.    On: 04/12/2021 13:11   MR THORACIC SPINE WO CONTRAST  Result Date: 04/12/2021 CLINICAL DATA:  Pain, progressive neurologic deficit, fall from lower extremity weakness, with increasing lower extremity weakness over the week. EXAM: MRI CERVICAL, THORACIC AND LUMBAR SPINE WITHOUT CONTRAST TECHNIQUE: Multiplanar and multiecho pulse sequences of the cervical spine, to include the craniocervical junction and cervicothoracic junction, and thoracic and lumbar spine, were obtained without intravenous contrast. COMPARISON:  None. FINDINGS: MRI CERVICAL SPINE FINDINGS Alignment: Physiologic. Vertebrae: No fracture, evidence of discitis, or bone lesion. Cord: Normal signal and morphology. Posterior Fossa, vertebral arteries, paraspinal tissues: Negative. Disc levels: C4-C5 mild left neural foraminal narrowing. No spinal canal stenosis. MRI THORACIC SPINE FINDINGS Alignment:  Physiologic. Vertebrae: No acute fracture. Osseous expansion of the vertebral body and posterior elements of T4 (series 20, image 15). Osseous expansion to a lesser extent of the left posterior aspect of the vertebral body and pedicle at T3 (series 20, image 10) and T5 (series 20, image 20) no focal abnormal osseous signal is seen these levels, however. Cord: Focal compression of the spinal cord at the level of T4-T5, with mildly increased T2 signal (series 20, image 15). The spinal cord is otherwise normal in signal and morphology. Paraspinal and other soft tissues: Negative. Disc levels: Severe spinal canal stenosis at T4-T5. Mild spinal canal stenosis at T3-T4. MRI LUMBAR SPINE FINDINGS Segmentation:  Standard. Alignment:  Physiologic. Vertebrae:  No fracture, evidence of discitis, or bone lesion. Conus medullaris and cauda equina: Conus extends to the L2 level. Conus and cauda equina appear normal. Paraspinal and other soft tissues: Negative. Disc levels: Moderate facet arthropathy at L4-L5, which causes mild left neural foraminal narrowing. No  other neural foraminal narrowing. No spinal canal stenosis. IMPRESSION: 1. 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 The osseous expansion is nonspecific but may be degenerative, with prominent posterior element hypertrophy and possible bulky ligamentous ossification. Consider CT thoracic spine for further evaluation. 2. Mild spinal canal stenosis at T3-T4. 3. No other high-grade spinal canal stenosis. Otherwise mild degenerative changes. These results were called by telephone at the time of interpretation on 04/12/2021 at 1:11 pm to provider MELANIE BELFI , who verbally acknowledged these results. Electronically Signed   By: Wiliam Ke M.D.   On: 04/12/2021 13:11   MR LUMBAR SPINE WO CONTRAST  Result Date: 04/12/2021 CLINICAL DATA:  Pain, progressive neurologic deficit, fall from lower extremity weakness, with increasing lower extremity weakness over the week. EXAM: MRI CERVICAL, THORACIC AND LUMBAR SPINE WITHOUT CONTRAST TECHNIQUE: Multiplanar and multiecho pulse sequences of the cervical spine, to include the craniocervical junction and cervicothoracic junction, and thoracic and lumbar spine, were obtained without intravenous contrast. COMPARISON:  None. FINDINGS: MRI CERVICAL SPINE FINDINGS Alignment: Physiologic. Vertebrae: No fracture, evidence of discitis, or bone lesion. Cord: Normal signal and morphology. Posterior Fossa, vertebral arteries, paraspinal tissues: Negative. Disc levels: C4-C5 mild left neural foraminal narrowing. No spinal canal stenosis. MRI THORACIC SPINE FINDINGS Alignment:  Physiologic. Vertebrae: No acute fracture. Osseous expansion of the vertebral body and posterior elements of T4 (series 20, image 15). Osseous expansion to a lesser extent of the left posterior aspect  of the  vertebral body and pedicle at T3 (series 20, image 10) and T5 (series 20, image 20) no focal abnormal osseous signal is seen these levels, however. Cord: Focal compression of the spinal cord at the level of T4-T5, with mildly increased T2 signal (series 20, image 15). The spinal cord is otherwise normal in signal and morphology. Paraspinal and other soft tissues: Negative. Disc levels: Severe spinal canal stenosis at T4-T5. Mild spinal canal stenosis at T3-T4. MRI LUMBAR SPINE FINDINGS Segmentation:  Standard. Alignment:  Physiologic. Vertebrae:  No fracture, evidence of discitis, or bone lesion. Conus medullaris and cauda equina: Conus extends to the L2 level. Conus and cauda equina appear normal. Paraspinal and other soft tissues: Negative. Disc levels: Moderate facet arthropathy at L4-L5, which causes mild left neural foraminal narrowing. No other neural foraminal narrowing. No spinal canal stenosis. IMPRESSION: 1. 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 The osseous expansion is nonspecific but may be degenerative, with prominent posterior element hypertrophy and possible bulky ligamentous ossification. Consider CT thoracic spine for further evaluation. 2. Mild spinal canal stenosis at T3-T4. 3. No other high-grade spinal canal stenosis. Otherwise mild degenerative changes. These results were called by telephone at the time of interpretation on 04/12/2021 at 1:11 pm to provider MELANIE BELFI , who verbally acknowledged these results. Electronically Signed   By: Wiliam Ke M.D.   On: 04/12/2021 13:11    Assessment/Plan: Thoracic stenosis, thoracic myelopathy: I have answered all his questions regarding surgery.  He wants to proceed with a T4-5 laminectomy as scheduled tomorrow.  LOS: 2 days      Cristi Loron 04/14/2021, 9:52 AM     Patient ID: Cristian Wilson, male   DOB: 07-04-1980, 40 y.o.   MRN: [id number]

## 2021-04-15 ENCOUNTER — Inpatient Hospital Stay (HOSPITAL_COMMUNITY)
Admission: EM | Disposition: A | Discharge status: Home / Self Care | Payer: Self-pay | Source: Home / Self Care | Attending: Neurosurgery

## 2021-04-15 ENCOUNTER — Inpatient Hospital Stay (HOSPITAL_COMMUNITY): Payer: BC Managed Care – PPO | Admitting: Certified Registered"

## 2021-04-15 ENCOUNTER — Inpatient Hospital Stay (HOSPITAL_COMMUNITY): Payer: BC Managed Care – PPO

## 2021-04-15 ENCOUNTER — Encounter (HOSPITAL_COMMUNITY): Payer: Self-pay | Admitting: Internal Medicine

## 2021-04-15 HISTORY — PX: LUMBAR LAMINECTOMY/DECOMPRESSION MICRODISCECTOMY: SHX5026

## 2021-04-15 LAB — SURGICAL PCR SCREEN
MRSA, PCR: NEGATIVE
Staphylococcus aureus: NEGATIVE

## 2021-04-15 SURGERY — LUMBAR LAMINECTOMY/DECOMPRESSION MICRODISCECTOMY 1 LEVEL
Anesthesia: General | Site: Back

## 2021-04-15 MED ORDER — FENTANYL CITRATE (PF) 250 MCG/5ML IJ SOLN
INTRAMUSCULAR | Status: AC
  Filled 2021-04-15: qty 5

## 2021-04-15 MED ORDER — LACTATED RINGERS IV SOLN: INTRAVENOUS | Status: DC | PRN

## 2021-04-15 MED ORDER — LIDOCAINE 2% (20 MG/ML) 5 ML SYRINGE
INTRAMUSCULAR | Status: DC | PRN
  Administered 2021-04-15: 60 mg via INTRAVENOUS

## 2021-04-15 MED ORDER — MIDAZOLAM HCL 2 MG/2ML IJ SOLN
INTRAMUSCULAR | Status: AC
  Filled 2021-04-15: qty 2

## 2021-04-15 MED ORDER — ONDANSETRON HCL 4 MG PO TABS: 4.0000 mg | ORAL_TABLET | Freq: Four times a day (QID) | ORAL | Status: DC | PRN

## 2021-04-15 MED ORDER — PHENYLEPHRINE HCL (PRESSORS) 10 MG/ML IV SOLN
INTRAVENOUS | Status: DC | PRN
  Administered 2021-04-15 (×2): 80 ug via INTRAVENOUS

## 2021-04-15 MED ORDER — BACITRACIN ZINC 500 UNIT/GM EX OINT
TOPICAL_OINTMENT | CUTANEOUS | Status: AC
  Filled 2021-04-15: qty 28.35

## 2021-04-15 MED ORDER — THROMBIN 5000 UNITS EX SOLR
CUTANEOUS | Status: AC
  Filled 2021-04-15: qty 5000

## 2021-04-15 MED ORDER — SODIUM CHLORIDE 0.9% FLUSH
3.0000 mL | Freq: Two times a day (BID) | INTRAVENOUS | Status: DC
  Administered 2021-04-15: 3 mL via INTRAVENOUS

## 2021-04-15 MED ORDER — BUPIVACAINE LIPOSOME 1.3 % IJ SUSP
INTRAMUSCULAR | Status: AC
  Filled 2021-04-15: qty 20

## 2021-04-15 MED ORDER — BUPIVACAINE LIPOSOME 1.3 % IJ SUSP
INTRAMUSCULAR | Status: DC | PRN
  Administered 2021-04-15: 20 mL

## 2021-04-15 MED ORDER — PHENYLEPHRINE HCL-NACL 20-0.9 MG/250ML-% IV SOLN
INTRAVENOUS | Status: DC | PRN
Start: 2021-04-15 — End: 2021-04-15
  Administered 2021-04-15: 25 ug/min via INTRAVENOUS

## 2021-04-15 MED ORDER — CEFAZOLIN SODIUM-DEXTROSE 2-4 GM/100ML-% IV SOLN: 2.0000 g | Freq: Once | INTRAVENOUS | Status: DC

## 2021-04-15 MED ORDER — MIDAZOLAM HCL 5 MG/5ML IJ SOLN
INTRAMUSCULAR | Status: DC | PRN
  Administered 2021-04-15: 2 mg via INTRAVENOUS

## 2021-04-15 MED ORDER — GLYCOPYRROLATE PF 0.2 MG/ML IJ SOSY
PREFILLED_SYRINGE | INTRAMUSCULAR | Status: DC | PRN
  Administered 2021-04-15 (×2): .1 mg via INTRAVENOUS

## 2021-04-15 MED ORDER — BACITRACIN ZINC 500 UNIT/GM EX OINT
TOPICAL_OINTMENT | CUTANEOUS | Status: DC | PRN
  Administered 2021-04-15: 1 via TOPICAL

## 2021-04-15 MED ORDER — DEXTROSE 5 % IV SOLN
INTRAVENOUS | Status: DC | PRN
  Administered 2021-04-15: 3 g via INTRAVENOUS

## 2021-04-15 MED ORDER — ACETAMINOPHEN 10 MG/ML IV SOLN
INTRAVENOUS | Status: DC | PRN
  Administered 2021-04-15: 1000 mg via INTRAVENOUS

## 2021-04-15 MED ORDER — ACETAMINOPHEN 10 MG/ML IV SOLN
INTRAVENOUS | Status: AC
  Filled 2021-04-15: qty 100

## 2021-04-15 MED ORDER — CYCLOBENZAPRINE HCL 10 MG PO TABS: 10.0000 mg | ORAL_TABLET | Freq: Three times a day (TID) | ORAL | Status: DC | PRN

## 2021-04-15 MED ORDER — DEXAMETHASONE SODIUM PHOSPHATE 10 MG/ML IJ SOLN
INTRAMUSCULAR | Status: DC | PRN
  Administered 2021-04-15: 10 mg via INTRAVENOUS

## 2021-04-15 MED ORDER — MENTHOL 3 MG MT LOZG: 1.0000 | LOZENGE | OROMUCOSAL | Status: DC | PRN

## 2021-04-15 MED ORDER — PROPOFOL 10 MG/ML IV BOLUS
INTRAVENOUS | Status: AC
  Filled 2021-04-15: qty 20

## 2021-04-15 MED ORDER — ONDANSETRON HCL 4 MG/2ML IJ SOLN: 4.0000 mg | Freq: Four times a day (QID) | INTRAMUSCULAR | Status: DC | PRN

## 2021-04-15 MED ORDER — DIPHENHYDRAMINE HCL 50 MG/ML IJ SOLN
INTRAMUSCULAR | Status: DC | PRN
  Administered 2021-04-15: 12.5 mg via INTRAVENOUS

## 2021-04-15 MED ORDER — ORAL CARE MOUTH RINSE: 15.0000 mL | Freq: Once | OROMUCOSAL | Status: AC

## 2021-04-15 MED ORDER — FENTANYL CITRATE (PF) 250 MCG/5ML IJ SOLN
INTRAMUSCULAR | Status: DC | PRN
  Administered 2021-04-15 (×5): 50 ug via INTRAVENOUS
  Administered 2021-04-15: 100 ug via INTRAVENOUS

## 2021-04-15 MED ORDER — PHENOL 1.4 % MT LIQD: 1.0000 | OROMUCOSAL | Status: DC | PRN

## 2021-04-15 MED ORDER — CEFAZOLIN IN SODIUM CHLORIDE 3-0.9 GM/100ML-% IV SOLN
3.0000 g | Freq: Three times a day (TID) | INTRAVENOUS | Status: AC
  Administered 2021-04-15 – 2021-04-16 (×2): 3 g via INTRAVENOUS
  Filled 2021-04-15 (×4): qty 100

## 2021-04-15 MED ORDER — CHLORHEXIDINE GLUCONATE 0.12 % MT SOLN
OROMUCOSAL | Status: AC
  Administered 2021-04-15: 15 mL via OROMUCOSAL
  Filled 2021-04-15: qty 15

## 2021-04-15 MED ORDER — HYDROMORPHONE HCL 1 MG/ML IJ SOLN: 0.2500 mg | INTRAMUSCULAR | Status: DC | PRN

## 2021-04-15 MED ORDER — SUGAMMADEX SODIUM 200 MG/2ML IV SOLN
INTRAVENOUS | Status: DC | PRN
  Administered 2021-04-15: 350 mg via INTRAVENOUS

## 2021-04-15 MED ORDER — ACETAMINOPHEN 500 MG PO TABS
1000.0000 mg | ORAL_TABLET | Freq: Four times a day (QID) | ORAL | Status: AC
  Administered 2021-04-15 – 2021-04-16 (×3): 1000 mg via ORAL
  Filled 2021-04-15 (×4): qty 2

## 2021-04-15 MED ORDER — 0.9 % SODIUM CHLORIDE (POUR BTL) OPTIME
TOPICAL | Status: DC | PRN
  Administered 2021-04-15: 1000 mL

## 2021-04-15 MED ORDER — DOCUSATE SODIUM 100 MG PO CAPS
100.0000 mg | ORAL_CAPSULE | Freq: Two times a day (BID) | ORAL | Status: DC
  Administered 2021-04-15 – 2021-04-16 (×2): 100 mg via ORAL
  Filled 2021-04-15 (×2): qty 1

## 2021-04-15 MED ORDER — SODIUM CHLORIDE 0.9% FLUSH: 3.0000 mL | INTRAVENOUS | Status: DC | PRN

## 2021-04-15 MED ORDER — ONDANSETRON HCL 4 MG/2ML IJ SOLN
INTRAMUSCULAR | Status: DC | PRN
  Administered 2021-04-15: 4 mg via INTRAVENOUS

## 2021-04-15 MED ORDER — ROCURONIUM BROMIDE 10 MG/ML (PF) SYRINGE
PREFILLED_SYRINGE | INTRAVENOUS | Status: DC | PRN
  Administered 2021-04-15: 20 mg via INTRAVENOUS
  Administered 2021-04-15: 80 mg via INTRAVENOUS

## 2021-04-15 MED ORDER — OXYCODONE HCL 5 MG PO TABS
5.0000 mg | ORAL_TABLET | ORAL | Status: DC | PRN
  Administered 2021-04-16 (×3): 5 mg via ORAL
  Filled 2021-04-15 (×3): qty 1

## 2021-04-15 MED ORDER — ACETAMINOPHEN 650 MG RE SUPP: 650.0000 mg | RECTAL | Status: DC | PRN

## 2021-04-15 MED ORDER — MORPHINE SULFATE (PF) 4 MG/ML IV SOLN: 4.0000 mg | INTRAVENOUS | Status: DC | PRN

## 2021-04-15 MED ORDER — BUPIVACAINE-EPINEPHRINE (PF) 0.5% -1:200000 IJ SOLN
INTRAMUSCULAR | Status: DC | PRN
  Administered 2021-04-15: 10 mL

## 2021-04-15 MED ORDER — CLONIDINE HCL 0.1 MG PO TABS
0.1000 mg | ORAL_TABLET | Freq: Every day | ORAL | Status: DC | PRN
  Administered 2021-04-15: 0.1 mg via ORAL
  Filled 2021-04-15: qty 1

## 2021-04-15 MED ORDER — ACETAMINOPHEN 325 MG PO TABS
650.0000 mg | ORAL_TABLET | ORAL | Status: DC | PRN
  Administered 2021-04-16: 650 mg via ORAL
  Filled 2021-04-15: qty 2

## 2021-04-15 MED ORDER — LACTATED RINGERS IV SOLN: INTRAVENOUS | Status: DC

## 2021-04-15 MED ORDER — OXYCODONE HCL 5 MG PO TABS: 10.0000 mg | ORAL_TABLET | ORAL | Status: DC | PRN

## 2021-04-15 MED ORDER — HYDROMORPHONE HCL 1 MG/ML IJ SOLN
INTRAMUSCULAR | Status: AC
  Administered 2021-04-15: 0.5 mg via INTRAVENOUS
  Filled 2021-04-15: qty 1

## 2021-04-15 MED ORDER — THROMBIN 5000 UNITS EX SOLR
OROMUCOSAL | Status: DC | PRN
  Administered 2021-04-15 (×3): 5 mL via TOPICAL

## 2021-04-15 MED ORDER — CHLORHEXIDINE GLUCONATE 0.12 % MT SOLN: 15.0000 mL | Freq: Once | OROMUCOSAL | Status: AC

## 2021-04-15 MED ORDER — CHLORHEXIDINE GLUCONATE 0.12 % MT SOLN
OROMUCOSAL | Status: AC
  Filled 2021-04-15: qty 15

## 2021-04-15 MED ORDER — BISACODYL 10 MG RE SUPP: 10.0000 mg | Freq: Every day | RECTAL | Status: DC | PRN

## 2021-04-15 MED ORDER — ARTIFICIAL TEARS OPHTHALMIC OINT
TOPICAL_OINTMENT | OPHTHALMIC | Status: DC | PRN
  Administered 2021-04-15: 1 via OPHTHALMIC

## 2021-04-15 MED ORDER — SODIUM CHLORIDE 0.9 % IV SOLN: 250.0000 mL | INTRAVENOUS | Status: DC

## 2021-04-15 MED ORDER — PROPOFOL 10 MG/ML IV BOLUS
INTRAVENOUS | Status: DC | PRN
  Administered 2021-04-15: 200 mg via INTRAVENOUS

## 2021-04-15 SURGICAL SUPPLY — 49 items
BAG COUNTER SPONGE SURGICOUNT (BAG) ×4 IMPLANT
BAND RUBBER #18 3X1/16 STRL (MISCELLANEOUS) ×4 IMPLANT
BENZOIN TINCTURE PRP APPL 2/3 (GAUZE/BANDAGES/DRESSINGS) ×2 IMPLANT
BLADE CLIPPER SURG (BLADE) IMPLANT
BUR MATCHSTICK NEURO 3.0 LAGG (BURR) ×2 IMPLANT
BUR PRECISION FLUTE 6.0 (BURR) ×2 IMPLANT
CANISTER SUCT 3000ML PPV (MISCELLANEOUS) ×2 IMPLANT
CARTRIDGE OIL MAESTRO DRILL (MISCELLANEOUS) ×1 IMPLANT
DIFFUSER DRILL AIR PNEUMATIC (MISCELLANEOUS) ×2 IMPLANT
DRAPE LAPAROTOMY 100X72X124 (DRAPES) ×2 IMPLANT
DRAPE MICROSCOPE LEICA (MISCELLANEOUS) ×2 IMPLANT
DRAPE SURG 17X23 STRL (DRAPES) ×8 IMPLANT
ELECT BLADE 4.0 EZ CLEAN MEGAD (MISCELLANEOUS) ×2
ELECT REM PT RETURN 9FT ADLT (ELECTROSURGICAL) ×2
ELECTRODE BLDE 4.0 EZ CLN MEGD (MISCELLANEOUS) ×1 IMPLANT
ELECTRODE REM PT RTRN 9FT ADLT (ELECTROSURGICAL) ×1 IMPLANT
EVACUATOR 1/8 PVC DRAIN (DRAIN) ×2 IMPLANT
GAUZE 4X4 16PLY ~~LOC~~+RFID DBL (SPONGE) ×2 IMPLANT
GAUZE SPONGE 4X4 12PLY STRL (GAUZE/BANDAGES/DRESSINGS) IMPLANT
GLOVE EXAM NITRILE XL STR (GLOVE) IMPLANT
GLOVE SURG ENC MOIS LTX SZ8 (GLOVE) ×2 IMPLANT
GLOVE SURG ENC MOIS LTX SZ8.5 (GLOVE) ×2 IMPLANT
GOWN STRL REUS W/ TWL LRG LVL3 (GOWN DISPOSABLE) ×1 IMPLANT
GOWN STRL REUS W/ TWL XL LVL3 (GOWN DISPOSABLE) ×1 IMPLANT
GOWN STRL REUS W/TWL 2XL LVL3 (GOWN DISPOSABLE) ×2 IMPLANT
GOWN STRL REUS W/TWL LRG LVL3 (GOWN DISPOSABLE) ×1
GOWN STRL REUS W/TWL XL LVL3 (GOWN DISPOSABLE) ×1
HEMOSTAT POWDER KIT SURGIFOAM (HEMOSTASIS) ×6 IMPLANT
KIT BASIN OR (CUSTOM PROCEDURE TRAY) ×2 IMPLANT
KIT TURNOVER KIT B (KITS) ×2 IMPLANT
NEEDLE HYPO 21X1.5 SAFETY (NEEDLE) ×2 IMPLANT
NEEDLE HYPO 22GX1.5 SAFETY (NEEDLE) ×2 IMPLANT
NS IRRIG 1000ML POUR BTL (IV SOLUTION) ×2 IMPLANT
OIL CARTRIDGE MAESTRO DRILL (MISCELLANEOUS) ×2
PACK LAMINECTOMY NEURO (CUSTOM PROCEDURE TRAY) ×2 IMPLANT
PAD ARMBOARD 7.5X6 YLW CONV (MISCELLANEOUS) ×12 IMPLANT
PATTIES SURGICAL .5 X.5 (GAUZE/BANDAGES/DRESSINGS) ×2 IMPLANT
PATTIES SURGICAL .5 X1 (DISPOSABLE) IMPLANT
PATTIES SURGICAL 1X1 (DISPOSABLE) ×4 IMPLANT
SPONGE SURGIFOAM ABS GEL SZ50 (HEMOSTASIS) IMPLANT
SPONGE T-LAP 4X18 ~~LOC~~+RFID (SPONGE) ×2 IMPLANT
STRIP CLOSURE SKIN 1/2X4 (GAUZE/BANDAGES/DRESSINGS) ×2 IMPLANT
SUT VIC AB 1 CT1 18XBRD ANBCTR (SUTURE) ×1 IMPLANT
SUT VIC AB 1 CT1 8-18 (SUTURE) ×1
SUT VIC AB 2-0 CP2 18 (SUTURE) ×2 IMPLANT
SYR 20ML LL LF (SYRINGE) ×2 IMPLANT
TOWEL GREEN STERILE (TOWEL DISPOSABLE) ×2 IMPLANT
TOWEL GREEN STERILE FF (TOWEL DISPOSABLE) ×2 IMPLANT
WATER STERILE IRR 1000ML POUR (IV SOLUTION) ×2 IMPLANT

## 2021-04-15 NOTE — Transfer of Care (Signed)
Immediate Anesthesia Transfer of Care Note  Patient: Cristian Wilson  Procedure(s) Performed: THORACIC FOUR-FIVE LAMINECTOMY/DECOMPRESSION MICRODISCECTOMY (Back)  Patient Location: PACU  Anesthesia Type:General  Level of Consciousness: drowsy, patient cooperative and responds to stimulation  Airway & Oxygen Therapy: Patient Spontanous Breathing and Patient connected to face mask oxygen  Post-op Assessment: Report given to RN and Post -op Vital signs reviewed and stable  Post vital signs: Reviewed and stable  Last Vitals:  Vitals Value Taken Time  BP 143/82 04/15/21 1733  Temp 36.4 C 04/15/21 1735  Pulse 83 04/15/21 1735  Resp 16 04/15/21 1740  SpO2 100   Vitals shown include unvalidated device data.  Last Pain:  Vitals:   04/15/21 1735  TempSrc:   PainSc: Asleep         Complications: No notable events documented.

## 2021-04-15 NOTE — TOC Initial Note (Signed)
Transition of Care [facility]) - Initial/Assessment Note    Patient Details  Name: Cristian Wilson MRN: [id number] Date of Birth: 1980-07-13  Transition of Care [facility]) CM/SW Contact:    Epifanio Lesches, RN Phone Number: 04/15/2021, 1:31 PM  Clinical Narrative:           Admitted Thoracic stenosis myelopathy and paresthesias.      PLAN: surgery with laminectomy T4-T5, 10/17  Pt plans to recovery @ sister's residence post d/c: [address] , [place], [address] Cristian Wilson (Sister)       [phone]       [facility] team following for toc needs....  Expected Discharge Plan: Home/Self Care Barriers to Discharge: Continued Medical Work up   Patient Goals and CMS Choice        Expected Discharge Plan and Services Expected Discharge Plan: Home/Self Care                                              Prior Living Arrangements/Services                       Activities of Daily Living Home Assistive Devices/Equipment: None ADL Screening (condition at time of admission) Patient's cognitive ability adequate to safely complete daily activities?: Yes Is the patient deaf or have difficulty hearing?: No Does the patient have difficulty seeing, even when wearing glasses/contacts?: No Does the patient have difficulty concentrating, remembering, or making decisions?: No Patient able to express need for assistance with ADLs?: Yes Does the patient have difficulty dressing or bathing?: No Independently performs ADLs?: No Communication: Independent Dressing (OT): Independent Grooming: Independent Feeding: Independent Bathing: Independent Toileting: Needs assistance Is this a change from baseline?: Change from baseline, expected to last <3 days In/Out Bed: Needs assistance Is this a change from baseline?: Change from baseline, expected to last >3 days Walks in Home: Needs assistance Is this a change from baseline?: Change from baseline, expected to last >3  days Does the patient have difficulty walking or climbing stairs?: Yes Weakness of Legs: Both Weakness of Arms/Hands: None  Permission Sought/Granted                  Emotional Assessment              Admission diagnosis:  Spinal stenosis of thoracic region [M48.04] Hypokalemia [E87.6] Spinal stenosis [M48.00] Hypertension, unspecified type [I10] Patient Active Problem List   Diagnosis Date Noted   Spinal stenosis 04/12/2021   PCP:  Patient, No Pcp Per (Inactive) Pharmacy:   [facility] [id number] [place], [place] - [address] [place] [address] Phone: [phone] Fax: [phone]     Social Determinants of Health (SDOH) Interventions    Readmission Risk Interventions No flowsheet data found.

## 2021-04-15 NOTE — Anesthesia Procedure Notes (Signed)
Procedure Name: Intubation Date/Time: 04/15/2021 2:58 PM Performed by: Zollie Scale, CRNA Pre-anesthesia Checklist: Patient identified, Emergency Drugs available, Suction available and Patient being monitored Patient Re-evaluated:Patient Re-evaluated prior to induction Oxygen Delivery Method: Circle System Utilized Preoxygenation: Pre-oxygenation with 100% oxygen Induction Type: IV induction Ventilation: Mask ventilation without difficulty and Oral airway inserted - appropriate to patient size Laryngoscope Size: Miller and 3 Grade View: Grade II Tube type: Oral Tube size: 7.5 mm Number of attempts: 1 Airway Equipment and Method: Stylet and Oral airway Placement Confirmation: ETT inserted through vocal cords under direct vision, positive ETCO2 and breath sounds checked- equal and bilateral Secured at: 23 cm Tube secured with: Tape Dental Injury: Teeth and Oropharynx as per pre-operative assessment

## 2021-04-15 NOTE — Progress Notes (Addendum)
Patient seen examined and comfortable Playing video games Bp controlled--medical issues all seem very stable  Ready for surgery We will follow-peri-op for final recs in am  [Or later today if there is a need]    Addend--called about blood pressure 170 sys.  Start clonidine 0.1 daily for pressure > 160 sys  No charge  Pleas Koch, MD [facility] 10:26 AM

## 2021-04-15 NOTE — Op Note (Signed)
Brief history: The patient is a 40 year old morbidly obese black male who presented with bilateral lower extremity weakness.  He was worked up with a thoracic MRI which demonstrated severe stenosis at T4-5.  I discussed the various treatment options with him.  He has decided proceed with surgery.  Preop diagnosis: Thoracic spinal stenosis, thoracic myelopathy, paraparesis, thoracic spine pain  Postop diagnosis: The same  Procedure: T4 and T5 laminectomy using microdissection  Surgeon: Dr. Delma Officer  Assistant: Hildred Priest, NP  Anesthesia: General tracheal  Estimated blood loss: 300 cc  Drains: 1 epidural Hemovac drain  Complications: None  Description of procedure: The patient was brought to the operating room by the anesthesia team.  General endotracheal anesthesia was induced.  The patient was placed in the prone position on the [place] frame.  His thoracic region was then prepared with Betadine scrub and Betadine solution.  Sterile drapes were applied.  I then injected the area to be incised with Marcaine with epinephrine solution.  I used a scalpel to make a midline incision over the T4-5 interspace.  I used electrocautery to perform bilateral subperiosteal dissection exposing the spinous process and lamina of T4 and T5.  We obtained an intraoperative radiograph to confirm our location.  We inserted the [facility] retractor for exposure.  I then used electrocautery to incise the T3-4, T4-5 and T5-6 interspinous ligament.  I remove the T4 and T5 spinous process with a [place] rongeur.  We then brought the operative microscope into the field and under its magnification and illumination we completed the microdissection/decompression.  I used a high-speed drill to perform bilateral T4 and T5 laminotomies.  We used the Kerrison punches to carefully complete the T4 and T5 laminectomy.  We removed the ligamentum flavum at T3-4 T4-5 and T5-6.  As expected we encountered overgrowth of the  facets bilaterally.  We drilled off the medial aspect of the facets and widen the laminectomy with a Kerrison punches further decompressing the thecal sac.  We then obtained hemostasis with bipolar cautery and Floseal.  The wound was then irrigated out with saline solution.  We then placed a medium Hemovac drain in the epidural space and tunneled out through a separate stab wound.  We then remove the retractor and reapproximated patient's thoracic fascia with interrupted #1 Vicryl suture.  We reapproximated the subcutaneous tissue with interrupted 2-0 Vicryl suture.  We then reapproximated the skin with Steri-Strips and benzoin.  A sterile dressing was applied.  The drapes were removed.  By report all sponge, instrument, and needle counts were correct at the end this case.

## 2021-04-15 NOTE — Anesthesia Preprocedure Evaluation (Signed)
Anesthesia Evaluation  Patient identified by MRN, date of birth, ID band Patient awake    Reviewed: Allergy & Precautions, H&P , NPO status , Patient's Chart, lab work & pertinent test results  Airway Mallampati: II  TM Distance: >3 FB Neck ROM: Full    Dental no notable dental hx. (+) Teeth Intact, Dental Advisory Given   Pulmonary neg pulmonary ROS,    Pulmonary exam normal breath sounds clear to auscultation       Cardiovascular hypertension, Pt. on medications  Rhythm:Regular Rate:Normal     Neuro/Psych negative neurological ROS  negative psych ROS   GI/Hepatic negative GI ROS, Neg liver ROS,   Endo/Other  Morbid obesity  Renal/GU negative Renal ROS  negative genitourinary   Musculoskeletal   Abdominal   Peds  Hematology negative hematology ROS (+)   Anesthesia Other Findings   Reproductive/Obstetrics negative OB ROS                             Anesthesia Physical Anesthesia Plan  ASA: 3  Anesthesia Plan: General   Post-op Pain Management:    Induction: Intravenous  PONV Risk Score and Plan: 3 and Ondansetron, Dexamethasone and Midazolam  Airway Management Planned: Oral ETT  Additional Equipment:   Intra-op Plan:   Post-operative Plan: Extubation in OR  Informed Consent: I have reviewed the patients History and Physical, chart, labs and discussed the procedure including the risks, benefits and alternatives for the proposed anesthesia with the patient or authorized representative who has indicated his/her understanding and acceptance.     Dental advisory given  Plan Discussed with: CRNA  Anesthesia Plan Comments:         Anesthesia Quick Evaluation

## 2021-04-15 NOTE — Progress Notes (Signed)
Subjective: Patient is alert and pleasant.  Objective: Vital signs in last 24 hours: Temp:  [97.6 F (36.4 C)-98.3 F (36.8 C)] 97.6 F (36.4 C) (10/17 1334) Pulse Rate:  [72-81] 81 (10/17 1334) Resp:  [16-18] 18 (10/17 1334) BP: (135-171)/(78-109) 168/103 (10/17 1334) SpO2:  [94 %-100 %] 94 % (10/17 1334) Weight:  [147.4 kg] 147.4 kg (10/17 1334) Estimated body mass index is 40.62 kg/m as calculated from the following:   Height as of this encounter: 6\' 3"  (1.905 m).   Weight as of this encounter: 147.4 kg.   Intake/Output from previous day: 10/16 0701 - 10/17 0700 In: 720 [P.O.:720] Out: 300 [Urine:300] Intake/Output this shift: Total I/O In: -  Out: 400 [Urine:400]  Physical exam the patient is alert and pleasant.  He is moving his lower extremities well.  He is slightly weaker on the left.  Lab Results: Recent Labs    04/13/21 0226 04/14/21 0216  WBC 9.8 14.4*  HGB 13.4 13.7  HCT 41.9 43.1  PLT 332 361   BMET Recent Labs    04/13/21 0226 04/14/21 0216  NA 137 138  K 3.5 3.9  CL 104 105  CO2 24 25  GLUCOSE 131* 163*  BUN 10 16  CREATININE 1.10 1.16  CALCIUM 8.9 9.2    Studies/Results: No results found.  Assessment/Plan: Thoracic spinal stenosis, thoracic myelopathy, paraparesis: I have discussed the situation with the patient.  I have answered all his questions.  He wants to proceed with surgery.  LOS: 3 days     04/16/21 04/15/2021, 1:52 PM

## 2021-04-15 NOTE — Anesthesia Postprocedure Evaluation (Signed)
Anesthesia Post Note  Patient: Cristian Wilson  Procedure(s) Performed: THORACIC FOUR-FIVE LAMINECTOMY/DECOMPRESSION MICRODISCECTOMY (Back)     Patient location during evaluation: PACU Anesthesia Type: General Level of consciousness: awake and alert Pain management: pain level controlled Vital Signs Assessment: post-procedure vital signs reviewed and stable Respiratory status: spontaneous breathing, nonlabored ventilation, respiratory function stable and patient connected to nasal cannula oxygen Cardiovascular status: blood pressure returned to baseline and stable Postop Assessment: no apparent nausea or vomiting Anesthetic complications: no   No notable events documented.  Last Vitals:  Vitals:   04/15/21 1735 04/15/21 1750  BP: (!) 143/82 132/80  Pulse: 83 83  Resp: 17 14  Temp: 36.4 C   SpO2: 100% 100%    Last Pain:  Vitals:   04/15/21 1805  TempSrc:   PainSc: Asleep    LLE Motor Response: Purposeful movement (04/15/21 1805) LLE Sensation: Numbness (04/15/21 1805) RLE Motor Response: Purposeful movement (04/15/21 1805) RLE Sensation: Numbness (04/15/21 1805)      Peace Jost,W. EDMOND

## 2021-04-16 ENCOUNTER — Encounter (HOSPITAL_COMMUNITY): Payer: Self-pay | Admitting: Neurosurgery

## 2021-04-16 LAB — CBC WITH DIFFERENTIAL/PLATELET
Abs Immature Granulocytes: 0.08 10*3/uL — ABNORMAL HIGH (ref 0.00–0.07)
Basophils Absolute: 0 10*3/uL (ref 0.0–0.1)
Basophils Relative: 0 %
Eosinophils Absolute: 0 10*3/uL (ref 0.0–0.5)
Eosinophils Relative: 0 %
HCT: 36.9 % — ABNORMAL LOW (ref 39.0–52.0)
Hemoglobin: 11.7 g/dL — ABNORMAL LOW (ref 13.0–17.0)
Immature Granulocytes: 1 %
Lymphocytes Relative: 5 %
Lymphs Abs: 0.8 10*3/uL (ref 0.7–4.0)
MCH: 27.5 pg (ref 26.0–34.0)
MCHC: 31.7 g/dL (ref 30.0–36.0)
MCV: 86.6 fL (ref 80.0–100.0)
Monocytes Absolute: 1.6 10*3/uL — ABNORMAL HIGH (ref 0.1–1.0)
Monocytes Relative: 10 %
Neutro Abs: 13.5 10*3/uL — ABNORMAL HIGH (ref 1.7–7.7)
Neutrophils Relative %: 84 %
Platelets: 346 10*3/uL (ref 150–400)
RBC: 4.26 MIL/uL (ref 4.22–5.81)
RDW: 13.2 % (ref 11.5–15.5)
WBC: 16 10*3/uL — ABNORMAL HIGH (ref 4.0–10.5)
nRBC: 0 % (ref 0.0–0.2)

## 2021-04-16 LAB — COMPREHENSIVE METABOLIC PANEL
ALT: 37 U/L (ref 0–44)
AST: 32 U/L (ref 15–41)
Albumin: 2.9 g/dL — ABNORMAL LOW (ref 3.5–5.0)
Alkaline Phosphatase: 65 U/L (ref 38–126)
Anion gap: 7 (ref 5–15)
BUN: 24 mg/dL — ABNORMAL HIGH (ref 6–20)
CO2: 27 mmol/L (ref 22–32)
Calcium: 7.7 mg/dL — ABNORMAL LOW (ref 8.9–10.3)
Chloride: 102 mmol/L (ref 98–111)
Creatinine, Ser: 1.37 mg/dL — ABNORMAL HIGH (ref 0.61–1.24)
GFR, Estimated: >60 mL/min (ref >60)
Glucose, Bld: 186 mg/dL — ABNORMAL HIGH (ref 70–99)
Potassium: 3.2 mmol/L — ABNORMAL LOW (ref 3.5–5.1)
Sodium: 136 mmol/L (ref 135–145)
Total Bilirubin: 0.4 mg/dL (ref 0.3–1.2)
Total Protein: 5.7 g/dL — ABNORMAL LOW (ref 6.5–8.1)

## 2021-04-16 MED ORDER — CYCLOBENZAPRINE HCL 10 MG PO TABS: 10.0000 mg | ORAL_TABLET | Freq: Three times a day (TID) | ORAL | 0 refills | Status: AC | PRN

## 2021-04-16 MED ORDER — OXYCODONE-ACETAMINOPHEN 5-325 MG PO TABS: 1.0000 | ORAL_TABLET | ORAL | 0 refills | Status: AC | PRN

## 2021-04-16 MED ORDER — DOCUSATE SODIUM 100 MG PO CAPS: 100.0000 mg | ORAL_CAPSULE | Freq: Two times a day (BID) | ORAL | 0 refills | Status: AC

## 2021-04-16 MED ORDER — AMLODIPINE BESYLATE 10 MG PO TABS: 10.0000 mg | ORAL_TABLET | Freq: Every day | ORAL | 11 refills | Status: AC

## 2021-04-16 MED ORDER — LOSARTAN POTASSIUM 50 MG PO TABS: 50.0000 mg | ORAL_TABLET | Freq: Every day | ORAL | 11 refills | Status: AC

## 2021-04-16 MED ORDER — CLONIDINE HCL 0.1 MG PO TABS: 0.1000 mg | ORAL_TABLET | Freq: Every day | ORAL | 11 refills | Status: AC | PRN

## 2021-04-16 MED FILL — Thrombin For Soln 5000 Unit: CUTANEOUS | Qty: 5000 | Status: AC

## 2021-04-16 NOTE — Evaluation (Signed)
Physical Therapy Evaluation Patient Details Name: Cristian Wilson MRN: [id number] DOB: 03-Apr-1981 Today's Date: 04/16/2021  History of Present Illness  Pt. is 40 yr old M admitted on 10/14 for LE weakness and numbness x 1 week.  T-spine MRI positive for severe spinal canal stenosis and focal spinal cord compression at T4-T5.  MRI C-spine and L-spine negative. Pt. underwent T4/5 laminectomy on 10/17. PMH: Obesity, HTN  Clinical Impression  Pt. Was previously independent with all ADLs and functional mobility until 1 week ago when he developed numbness and weakness in B LE's.  After T4/5 lami, pt. Is requiring min A for bed mobility to follow spinal precautions and min-mod A for safety with transfers.  Pt. Demos dec activity tolerance and is only able to amb short distances at this time with use of a RW.  Pt. Would benefit from skilled PT in acute care to address deficits indicated in initial eval, including LE weakness, transfer difficulty, gait deviations and dec activity tolerance.  PT to practice stair negotiation with pt. Next tx to ensure safety with return to sister's home.  If unable, will need education on w/c transfer for steps.     Recommendations for follow up therapy are one component of a multi-disciplinary discharge planning process, led by the attending physician.  Recommendations may be updated based on patient status, additional functional criteria and insurance authorization.  Follow Up Recommendations Home health PT;Supervision for mobility/OOB    Equipment Recommendations  Rolling walker with 5" wheels;3in1 (PT)    Recommendations for Other Services       Precautions / Restrictions Precautions Precautions: Back Precaution Comments: Thoracic Restrictions Weight Bearing Restrictions: No      Mobility  Bed Mobility Overal bed mobility: Needs Assistance Bed Mobility: Rolling;Sidelying to Sit Rolling: Min assist Sidelying to sit: Min assist       General bed  mobility comments: Pt. educated on log rolling.  Req's min A to complete roll and SL > sit for LE negotiation, trunk support and VCs.  OT assists pt. with gown/undergarment change at EOB. Patient Response: Cooperative  Transfers Overall transfer level: Needs assistance Equipment used: Rolling walker (2 wheeled) Transfers: Sit to/from Stand Sit to Stand: Mod assist;Min assist         General transfer comment: Pt. req's mod A for sit > stand from elevated surface on 1st attempt.  VCs to push up from bed, and to lift head to improve upright posture in standing.  On 2nd attempt, pt. able to complete with min A only.  Ambulation/Gait Ambulation/Gait assistance: Min assist Gait Distance (Feet): 15 Feet Assistive device: Rolling walker (2 wheeled) Gait Pattern/deviations: Decreased step length - right;Decreased step length - left     General Gait Details: Pt. amb in room with slow cadence, demos knee hyperextension with activity.  Fatigues after short distance requiring chair to be brought over so pt. can sit.  Intermittent VCs to look up while amb.  Stairs            Wheelchair Mobility    Modified Rankin (Stroke Patients Only)       Balance Overall balance assessment: Mild deficits observed, not formally tested                                           Pertinent Vitals/Pain Pain Assessment: 0-10 Pain Score: 3  Pain Location: Between shoulders Pain Descriptors /  Indicators: Aching;Discomfort;Tender;Pressure Pain Intervention(s): Limited activity within patient's tolerance;Monitored during session;Repositioned    Home Living Family/patient expects to be discharged to:: Private residence Living Arrangements: Alone Available Help at Discharge: Family;Other (Comment) (Going to temporarily stay at sister's house. Sister works from home and will be available to assist.) Type of Home: House Home Access: Stairs to enter Entrance Stairs-Rails: None Water quality scientist of Steps: Flight for his house; 2 steps for sister's house Home Layout: Two level;Able to live on main level with bedroom/bathroom Home Equipment: None      Prior Function Level of Independence: Independent               Hand Dominance        Extremity/Trunk Assessment   Upper Extremity Assessment Upper Extremity Assessment: Overall WFL for tasks assessed    Lower Extremity Assessment Lower Extremity Assessment: Generalized weakness (Grossly 3/5 for B LEs)       Communication   Communication: No difficulties  Cognition Arousal/Alertness: Awake/alert Behavior During Therapy: WFL for tasks assessed/performed Overall Cognitive Status: Within Functional Limits for tasks assessed                                 General Comments: Alert and oriented x 3.  Agreeable to try amb in room.      General Comments General comments (skin integrity, edema, etc.): Educated on following spinal precautions, demos understanding.    Exercises     Assessment/Plan    PT Assessment Patient needs continued PT services  PT Problem List Decreased strength;Decreased mobility;Decreased activity tolerance;Decreased balance;Pain       PT Treatment Interventions DME instruction;Gait training;Balance training;Therapeutic exercise;Stair training;Functional mobility training;Therapeutic activities;Patient/family education    PT Goals (Current goals can be found in the Care Plan section)  Acute Rehab PT Goals Patient Stated Goal: Pt's goal is to return to independence PT Goal Formulation: With patient Time For Goal Achievement: 04/30/21 Potential to Achieve Goals: Good    Frequency Min 5X/week   Barriers to discharge Inaccessible home environment Pt. lives alone and has flight of stairs, however, can stay with sister who will work from home.  Will need to negotiate 2 steps to get into sister's house.    Co-evaluation PT/OT/SLP Co-Evaluation/Treatment:  Yes Reason for Co-Treatment: To address functional/ADL transfers PT goals addressed during session: Mobility/safety with mobility OT goals addressed during session: ADL's and self-care       AM-PAC PT "6 Clicks" Mobility  Outcome Measure Help needed turning from your back to your side while in a flat bed without using bedrails?: A Little Help needed moving from lying on your back to sitting on the side of a flat bed without using bedrails?: A Little Help needed moving to and from a bed to a chair (including a wheelchair)?: A Little Help needed standing up from a chair using your arms (e.g., wheelchair or bedside chair)?: A Lot Help needed to walk in hospital room?: A Little Help needed climbing 3-5 steps with a railing? : A Lot 6 Click Score: 16    End of Session Equipment Utilized During Treatment: Gait belt Activity Tolerance: Patient tolerated treatment well;Patient limited by fatigue Patient left: in chair;with call bell/phone within reach;with chair alarm set   PT Visit Diagnosis: Unsteadiness on feet (R26.81);Muscle weakness (generalized) (M62.81);Pain Pain - part of body:  (Thoracic)    Time: 0347-4259 PT Time Calculation (min) (ACUTE ONLY): 33 min   Charges:  PT Evaluation $PT Eval Low Complexity: 1 Low          Denell Cothern A. Georgetta Crafton, PT, DPT Acute Rehabilitation Services Office: [phone]   Mesa Janus A Amareon Phung 04/16/2021, 11:32 AM

## 2021-04-16 NOTE — Progress Notes (Signed)
AVS given and reviewed with pt and sister at bedside. Medications discussed. Rolling walker and 3n1 delivered to bedside. All questions answered to satisfaction. Pt verbalized understanding of information given. Pt to be escorted off the unit with all belongings via wheelchair by staff member.

## 2021-04-16 NOTE — Plan of Care (Signed)

## 2021-04-16 NOTE — TOC Transition Note (Signed)
Transition of Care [facility]) - CM/SW Discharge Note   Patient Details  Name: Cristian Wilson MRN: [id number] Date of Birth: [birth year]  [facility]) CM/SW Contact:  Epifanio Lesches, RN Phone Number: 04/16/2021, 3:48 PM   Clinical Narrative:    Patient will DC to: home Anticipated DC date: 04/16/2021 Family notified:sister,Cristian Wilson Transport by: car           S/p T4 and T5 laminectomy 10/17 Per MD patient ready for DC today. RN, patient, and patient's sister made aware of DC. Pt to recover @ sister's residence. DME : rolling walker and 3in 1/BSC will be delivered to bedside prior to d/c by [facility].  Pt without Rx med concerns/ affordability issues.  Post follow up appointments noted on AVS.  Cristian Wilson (Sister)       [phone]      Sister to provide transportation to home.  RNCM will sign off for now as intervention is no longer needed. Please consult [place] again if new needs arise.    Final next level of care: Home/Self Care Barriers to Discharge: No Barriers Identified   Patient Goals and CMS Choice     Choice offered to / list presented to : Patient  Discharge Placement    Discharge Plan and Services                DME Arranged: 3-N-1, Walker rolling DME Agency: [facility] Date DME Agency Contacted: 04/16/21 Time DME Agency Contacted: [phone] Representative spoke with at DME Agency: Velna Hatchet            Social Determinants of Health (SDOH) Interventions     Readmission Risk Interventions No flowsheet data found.

## 2021-04-16 NOTE — Discharge Summary (Signed)
Physician Discharge Summary     Providing Compassionate, Quality Care - Together   Patient ID: Cristian Wilson MRN: [id number] DOB/AGE: 07-10-1980 40 y.o.  Admit date: 04/12/2021 Discharge date: 04/16/2021  Admission Diagnoses: Spinal stenosis  Discharge Diagnoses:  Active Problems:   Spinal stenosis   Discharged Condition: good  Hospital Course: Patient underwent a T4 and T5 laminectomy by Dr. Lovell Sheehan on 1017/2022. He was admitted to 5N09 following recovery from anesthesia in the PACU. His postoperative course has been uncomplicated. He has worked with both physical and occupational therapies who feel the patient is ready for discharge home. He is ambulating with the aid of the walker. He is tolerating a normal diet. He is not having any bowel or bladder dysfunction. His pain is well-controlled with oral pain medication. He is ready for discharge home.   Consults: PT, OT  Significant Diagnostic Studies: radiology: DG Thoracic Spine 2 View  Result Date: 04/15/2021 CLINICAL DATA:  Surgical localization images. EXAM: THORACIC SPINE 2 VIEWS COMPARISON:  MRI 04/12/2021 FINDINGS: The first image demonstrates a surgical marker at the level of T2-T3. The second image demonstrates a surgical marker overlying the T5 vertebral body. Endotracheal tube is positioned well above the carina. IMPRESSION: Surgical marking at the level of T5. This report was called to the operating room at the time of interpretation. Electronically Signed   By: Richarda Overlie M.D.   On: 04/15/2021 15:57     Treatments: surgery: T4 and T5 laminectomy using microdissection  Discharge Exam: Blood pressure 130/66, pulse 69, temperature 98.8 F (37.1 C), temperature source Oral, resp. rate 16, height 6\' 3"  (1.905 m), weight (!) 147.4 kg, SpO2 93 %.  Per report: Alert and oriented x 4 PERRLA CN II-XII grossly intact MAE, Strength and sensation intact Incision is covered with Honeycomb dressing and Steri Strips;  Dressing is clean, dry, and intact   Disposition: Discharge disposition: 01-Home or Self Care       Discharge Instructions     For home use only DME 3 n 1   Complete by: As directed    For home use only DME Walker rolling   Complete by: As directed    Walker: With 5 Inch Wheels   Patient needs a walker to treat with the following condition: Spinal stenosis      Allergies as of 04/16/2021   No Known Allergies      Medication List     TAKE these medications    amLODipine 10 MG tablet Commonly known as: NORVASC Take 1 tablet (10 mg total) by mouth daily. Start taking on: April 17, 2021   cloNIDine 0.1 MG tablet Commonly known as: CATAPRES Take 1 tablet (0.1 mg total) by mouth daily as needed (for pressure above 160).   cyclobenzaprine 10 MG tablet Commonly known as: FLEXERIL Take 1 tablet (10 mg total) by mouth 3 (three) times daily as needed for muscle spasms.   docusate sodium 100 MG capsule Commonly known as: COLACE Take 1 capsule (100 mg total) by mouth 2 (two) times daily.   losartan 50 MG tablet Commonly known as: COZAAR Take 1 tablet (50 mg total) by mouth daily. Start taking on: April 17, 2021   oxyCODONE-acetaminophen 5-325 MG tablet Commonly known as: Percocet Take 1 tablet by mouth every 4 (four) hours as needed for severe pain.               Durable Medical Equipment  (From admission, onward)  Start     Ordered   04/16/21 0000  For home use only DME Walker rolling       Question Answer Comment  Walker: With 5 Inch Wheels   Patient needs a walker to treat with the following condition Spinal stenosis      04/16/21 1454   04/16/21 0000  For home use only DME 3 n 1        04/16/21 1454            Follow-up Information     Dr.Karrar Donette Larry Follow up on 07/10/2021.   Why: 11am with Dr. Hillery Hunter information: [facility] @ [place] [address] [place], [place] [address]   [phone]        Tressie Stalker, MD. Schedule an appointment as soon as possible for a visit in 3 week(s).   Specialty: Neurosurgery Contact information: [address] [place] [address] [phone]                 Signed: Val Eagle, DNP, AGNP-C Nurse Practitioner  [facility] [address], [address], [place], [place] [address] P: [phone]    F: [phone]  04/16/2021, 2:56 PM

## 2021-04-16 NOTE — Plan of Care (Signed)

## 2021-04-16 NOTE — Final Consult Note (Signed)
Med recommendations for discharge reconciled on [facility]  Patient seen and examined doing fair blood pressures slightly elevated but not hypertensive emergency hi staff seen is 155  BP (!) 142/89 (BP Location: Left Arm)   Pulse 71   Temp 98.8 F (37.1 C) (Oral)   Resp 17   Ht 6\' 3"  (1.905 m)   Wt (!) 147.4 kg   SpO2 96%   BMI 40.62 kg/m  Having tenderness and some inability to move-- is a little bit less mobile Overall he feels fair  P Patient will need losartan 50 daily Amlodipine 10 daily If blood pressure is above 160 will need clonidine 0.1  He will need because he is on an ARB blood chemistries in about 1 to 2 weeks on follow-up He obviously will need some weight loss counseling Appreciate the consult please call me directly if you have further questions  , MD [facility] 10:16 AM

## 2021-04-16 NOTE — Evaluation (Signed)
Occupational Therapy Evaluation Patient Details Name: Cristian Wilson MRN: [id number] DOB: 12/02/1980 Today's Date: 04/16/2021   History of Present Illness Pt. is 40 yr old M admitted on 10/14 for LE weakness and numbness x 1 week.  T-spine MRI positive for severe spinal canal stenosis and focal spinal cord compression at T4-T5.  MRI C-spine and L-spine negative. Pt. underwent T4/5 laminectomy on 10/17. PMH: Obesity, HTN   Clinical Impression   Pt presents with decreased balance/activity tolerance/mobility and pain. Pt currently requiring Mod A for LB ADLs and Min-Mod A with functional transfers/mobility. Pt reports being independent at baseline and lives alone. Pt plans to stay with his sister who works from home and will be available to provide assistance as needed for a couple of weeks prior to return home. Pt would likely benefit from [facility] for AE training, home safety evaluation, and to improve safety/independence with ADLs and functional transfers/mobility. Will follow acutely.     Recommendations for follow up therapy are one component of a multi-disciplinary discharge planning process, led by the attending physician.  Recommendations may be updated based on patient status, additional functional criteria and insurance authorization.   Follow Up Recommendations  Home health OT;Supervision - Intermittent    Equipment Recommendations  3 in 1 bedside commode    Recommendations for Other Services       Precautions / Restrictions Precautions Precautions: Back Precaution Comments: Thoracic Restrictions Weight Bearing Restrictions: No      Mobility Bed Mobility Overal bed mobility: Needs Assistance Bed Mobility: Rolling;Sidelying to Sit Rolling: Min assist Sidelying to sit: Min assist       General bed mobility comments: Pt. educated on log rolling.  Req's min A to complete roll and SL > sit for LE negotiation, trunk support and VCs.  OT assists pt. with gown/undergarment  change at EOB.    Transfers Overall transfer level: Needs assistance Equipment used: Rolling walker (2 wheeled) Transfers: Sit to/from Stand Sit to Stand: Mod assist;Min assist         General transfer comment: Pt. req's mod A for sit > stand from elevated surface on 1st attempt.  VCs to push up from bed, and to lift head to improve upright posture in standing.  On 2nd attempt, pt. able to complete with min A only.    Balance Overall balance assessment: Mild deficits observed, not formally tested                                         ADL either performed or assessed with clinical judgement   ADL Overall ADL's : Needs assistance/impaired Eating/Feeding: Independent;Sitting   Grooming: Set up;Sitting   Upper Body Bathing: Set up;Sitting   Lower Body Bathing: Moderate assistance;Sitting/lateral leans;Cueing for back precautions   Upper Body Dressing : Set up;Sitting   Lower Body Dressing: Moderate assistance;Sitting/lateral leans;Sit to/from stand;Cueing for back precautions   Toilet Transfer: Moderate assistance;Ambulation;Comfort height toilet;Grab bars;RW   Toileting- Clothing Manipulation and Hygiene: Supervision/safety;Cueing for back precautions;Sitting/lateral lean       Functional mobility during ADLs: Minimal assistance;Rolling walker General ADL Comments: Pt limited by pain, balance, activity tolerance, and back precautions.     Vision Baseline Vision/History: 1 Wears glasses       Perception     Praxis      Pertinent Vitals/Pain Pain Assessment: 0-10 Pain Score: 3  Pain Location: Between shoulders Pain Descriptors / Indicators:  Aching;Discomfort;Tender;Pressure Pain Intervention(s): Limited activity within patient's tolerance;Monitored during session;Repositioned     Hand Dominance     Extremity/Trunk Assessment Upper Extremity Assessment Upper Extremity Assessment: Overall WFL for tasks assessed   Lower Extremity  Assessment Lower Extremity Assessment: Generalized weakness (Grossly 3/5 for B LEs)       Communication Communication Communication: No difficulties   Cognition Arousal/Alertness: Awake/alert Behavior During Therapy: WFL for tasks assessed/performed Overall Cognitive Status: Within Functional Limits for tasks assessed                                 General Comments: Alert and oriented x 3.  Agreeable to try amb in room.   General Comments  Educated on following spinal precautions, demos understanding.    Exercises     Shoulder Instructions      Home Living Family/patient expects to be discharged to:: Private residence Living Arrangements: Alone Available Help at Discharge: Family;Other (Comment) (Going to temporarily stay at sister's house. Sister works from home and will be available to assist.) Type of Home: House Home Access: Stairs to enter Secretary/administrator of Steps: Flight for his house; 2 steps for sister's house Entrance Stairs-Rails: None Home Layout: Two level;Able to live on main level with bedroom/bathroom     Bathroom Shower/Tub: Chief Strategy Officer: Standard     Home Equipment: None          Prior Functioning/Environment Level of Independence: Independent                 OT Problem List: Decreased activity tolerance;Impaired balance (sitting and/or standing);Decreased safety awareness;Decreased knowledge of use of DME or AE;Decreased knowledge of precautions;Pain      OT Treatment/Interventions: Self-care/ADL training;Therapeutic exercise;Neuromuscular education;DME and/or AE instruction;Therapeutic activities;Patient/family education;Balance training    OT Goals(Current goals can be found in the care plan section) Acute Rehab OT Goals Patient Stated Goal: Pt's goal is to return to independence OT Goal Formulation: With patient Time For Goal Achievement: 04/30/21 Potential to Achieve Goals: Good  OT  Frequency: Min 2X/week   Barriers to D/C:            Co-evaluation   Reason for Co-Treatment: To address functional/ADL transfers PT goals addressed during session: Mobility/safety with mobility OT goals addressed during session: ADL's and self-care      AM-PAC OT "6 Clicks" Daily Activity     Outcome Measure Help from another person eating meals?: None Help from another person taking care of personal grooming?: A Little Help from another person toileting, which includes using toliet, bedpan, or urinal?: A Lot Help from another person bathing (including washing, rinsing, drying)?: A Lot Help from another person to put on and taking off regular upper body clothing?: A Little Help from another person to put on and taking off regular lower body clothing?: A Lot 6 Click Score: 16   End of Session Equipment Utilized During Treatment: Rolling walker;Gait belt Nurse Communication: Mobility status  Activity Tolerance: Patient limited by fatigue;Patient limited by pain Patient left: in chair;with call bell/phone within reach;with chair alarm set  OT Visit Diagnosis: Unsteadiness on feet (R26.81);Other abnormalities of gait and mobility (R26.89);Pain                Time: 4627-0350 OT Time Calculation (min): 33 min Charges:  OT General Charges $OT Visit: 1 Visit OT Evaluation $OT Eval Low Complexity: 1 Low  Truda Staub C, OT/L  Acute  Rehab [phone]  Lenice Llamas 04/16/2021, 11:51 AM

## 2021-04-16 NOTE — Progress Notes (Addendum)
Subjective: The patient is alert and pleasant.  His back is appropriately sore.  He has not ambulated yet.  Objective: Vital signs in last 24 hours: Temp:  [97.6 F (36.4 C)-98.3 F (36.8 C)] 98.1 F (36.7 C) (10/17 1820) Pulse Rate:  [59-96] 65 (10/17 2052) Resp:  [12-18] 16 (10/17 2052) BP: (132-171)/(80-109) 154/94 (10/17 2052) SpO2:  [91 %-100 %] 95 % (10/17 2052) Weight:  [147.4 kg] 147.4 kg (10/17 1334) Estimated body mass index is 40.62 kg/m as calculated from the following:   Height as of this encounter: 6\' 3"  (1.905 m).   Weight as of this encounter: 147.4 kg.   Intake/Output from previous day: 10/17 0701 - 10/18 0700 In: 2150 [I.V.:2000; IV Piggyback:150] Out: 800 [Urine:400; Blood:400] Intake/Output this shift: Total I/O In: -  Out: 625 [Urine:625]  Physical exam the patient is alert and oriented.  His lower extremity strength is grossly normal.  His drain has put out approximately 40 cc overnight.  Lab Results: Recent Labs    04/14/21 0216 04/16/21 0300  WBC 14.4* 16.0*  HGB 13.7 11.7*  HCT 43.1 36.9*  PLT 361 346   BMET Recent Labs    04/14/21 0216 04/16/21 0300  NA 138 136  K 3.9 3.2*  CL 105 102  CO2 25 27  GLUCOSE 163* 186*  BUN 16 24*  CREATININE 1.16 1.37*  CALCIUM 9.2 7.7*    Studies/Results: DG Thoracic Spine 2 View  Result Date: 04/15/2021 CLINICAL DATA:  Surgical localization images. EXAM: THORACIC SPINE 2 VIEWS COMPARISON:  MRI 04/12/2021 FINDINGS: The first image demonstrates a surgical marker at the level of T2-T3. The second image demonstrates a surgical marker overlying the T5 vertebral body. Endotracheal tube is positioned well above the carina. IMPRESSION: Surgical marking at the level of T5. This report was called to the operating room at the time of interpretation. Electronically Signed   By: 04/14/2021 M.D.   On: 04/15/2021 15:57    Assessment/Plan: Postop day #1: The patient seems to be doing well.  We will discontinue  his Hemovac.  I will ask PT to ambulate him.  He will likely go home later today.  I gave him his discharge instructions and answered all his questions.  LOS: 4 days     04/17/2021 04/16/2021, 7:57 AM     Patient ID: 04/18/2021, male   DOB: April 09, 1981, 41 y.o.   MRN: [id number]

## 2021-04-16 NOTE — Discharge Instructions (Signed)
Wound Care °Keep incision covered and dry for two days.    °Do not put any creams, lotions, or ointments on incision. °Leave steri-strips on back.  They will fall off by themselves. ° °Activity °Walk each and every day, increasing distance each day. °No lifting greater than 5 lbs.  Avoid excessive back motion. °No driving for 2 weeks; may ride as a passenger locally. ° °Diet °Resume your normal diet. °  °Return to Work °Will be discussed at your follow up appointment. ° °Call Your Doctor If Any of These Occur °Redness, drainage, or swelling at the wound.  °Temperature greater than 101 degrees. °Severe pain not relieved by pain medication. °Incision starts to come apart. ° °Follow Up Appt °Call today for appointment in 1-2 weeks ([phone]) or for problems. °

## 2023-05-07 ENCOUNTER — Ambulatory Visit
Admission: EM | Admit: 2023-05-07 | Discharge: 2023-05-07 | Disposition: A | Discharge status: Home / Self Care | Payer: No Typology Code available for payment source | Attending: Internal Medicine | Admitting: Internal Medicine

## 2023-05-07 DIAGNOSIS — I10 Essential (primary) hypertension: Secondary | ICD-10-CM

## 2023-05-07 DIAGNOSIS — N3001 Acute cystitis with hematuria: Secondary | ICD-10-CM

## 2023-05-07 LAB — POCT URINALYSIS DIP (MANUAL ENTRY)
Bilirubin, UA: NEGATIVE
Glucose, UA: >=1000 mg/dL — AB
Nitrite, UA: NEGATIVE
Protein Ur, POC: 30 mg/dL — AB
Spec Grav, UA: 1.01 (ref 1.010–1.025)
Urobilinogen, UA: 0.2 U/dL
pH, UA: 5.5 (ref 5.0–8.0)

## 2023-05-07 MED ORDER — SULFAMETHOXAZOLE-TRIMETHOPRIM 800-160 MG PO TABS
1.0000 | ORAL_TABLET | Freq: Two times a day (BID) | ORAL | 0 refills | Status: AC
Start: 2023-05-07 — End: 2023-05-14

## 2023-05-07 NOTE — ED Provider Notes (Signed)
[facility]    CSN: [id number] Arrival date & time: 05/07/23  0827      History   Chief Complaint Chief Complaint  Patient presents with   Dysuria    HPI Cristian Wilson is a 42 y.o. male presents for dysuria.  Patient reports 2 days of urinary burning with frequency.  Denies hematuria, urgency, fevers, nausea/vomiting, flank pain.  No penile discharge or testicular pain or swelling.  No STD exposure or concern.  Does report a remote history of UTIs about 5 years ago.  States he did have some distal penile itching for couple days but this is since resolved.  Denies rashes or lesions.  He reports he has a history of borderline diabetes.  Does have a hx of HTN and takes amlodipine and Cozaar.  No OTC medications have been used at the onset.  No other concerns at this time.  Dysuria Presenting symptoms: dysuria     Past Medical History:  Diagnosis Date   Obesity    UTI (lower urinary tract infection)     Patient Active Problem List   Diagnosis Date Noted   Spinal stenosis 04/12/2021    Past Surgical History:  Procedure Laterality Date   LEG SURGERY Left 1990   LUMBAR LAMINECTOMY/DECOMPRESSION MICRODISCECTOMY N/A 04/15/2021   Procedure: THORACIC FOUR-FIVE LAMINECTOMY/DECOMPRESSION MICRODISCECTOMY;  Surgeon: Tressie Stalker, MD;  Location: [facility] OR;  Service: Neurosurgery;  Laterality: N/A;       Home Medications    Prior to Admission medications   Medication Sig Start Date End Date Taking? Authorizing Provider  sulfamethoxazole-trimethoprim (BACTRIM DS) 800-160 MG tablet Take 1 tablet by mouth 2 (two) times daily for 7 days. 05/07/23 05/14/23 Yes Radford Pax, NP  amLODipine (NORVASC) 10 MG tablet Take 1 tablet (10 mg total) by mouth daily. 04/17/21   Val Eagle D, NP  cloNIDine (CATAPRES) 0.1 MG tablet Take 1 tablet (0.1 mg total) by mouth daily as needed (for pressure above 160). 04/16/21   Val Eagle D, NP  cyclobenzaprine (FLEXERIL) 10 MG tablet  Take 1 tablet (10 mg total) by mouth 3 (three) times daily as needed for muscle spasms. 04/16/21   Val Eagle D, NP  docusate sodium (COLACE) 100 MG capsule Take 1 capsule (100 mg total) by mouth 2 (two) times daily. 04/16/21   Val Eagle D, NP  losartan (COZAAR) 50 MG tablet Take 1 tablet (50 mg total) by mouth daily. 04/17/21   Floreen Comber, NP    Family History History reviewed. No pertinent family history.  Social History Social History   Tobacco Use   Smoking status: Never   Smokeless tobacco: Never  Vaping Use   Vaping status: Never Used  Substance Use Topics   Alcohol use: No   Drug use: No     Allergies   Patient has no known allergies.   Review of Systems Review of Systems  Genitourinary:  Positive for dysuria.     Physical Exam Triage Vital Signs ED Triage Vitals [05/07/23 0932]  Encounter Vitals Group     BP (!) 183/142     Systolic BP Percentile      Diastolic BP Percentile      Pulse Rate 100     Resp 17     Temp 98 F (36.7 C)     Temp Source Oral     SpO2 94 %     Weight      Height      Head Circumference  Peak Flow      Pain Score 3     Pain Loc      Pain Education      Exclude from Growth Chart    No data found.  Updated Vital Signs BP (!) 183/142 (BP Location: Left Arm)   Pulse 100   Temp 98 F (36.7 C) (Oral)   Resp 17   SpO2 94%   Visual Acuity Right Eye Distance:   Left Eye Distance:   Bilateral Distance:    Right Eye Near:   Left Eye Near:    Bilateral Near:     Physical Exam Vitals and nursing note reviewed.  Constitutional:      Appearance: Normal appearance.  HENT:     Head: Normocephalic and atraumatic.  Eyes:     Pupils: Pupils are equal, round, and reactive to light.  Cardiovascular:     Rate and Rhythm: Normal rate.  Pulmonary:     Effort: Pulmonary effort is normal.  Abdominal:     Tenderness: There is no right CVA tenderness or left CVA tenderness.  Skin:    General: Skin is warm  and dry.  Neurological:     General: No focal deficit present.     Mental Status: He is alert and oriented to person, place, and time.  Psychiatric:        Mood and Affect: Mood normal.        Behavior: Behavior normal.      UC Treatments / Results  Labs (all labs ordered are listed, but only abnormal results are displayed) Labs Reviewed  POCT URINALYSIS DIP (MANUAL ENTRY) - Abnormal; Notable for the following components:      Result Value   Clarity, UA cloudy (*)    Glucose, UA >=1,000 (*)    Ketones, POC UA large (80) (*)    Blood, UA moderate (*)    Protein Ur, POC =30 (*)    Leukocytes, UA Trace (*)    All other components within normal limits  URINE CULTURE    EKG   Radiology No results found.  Procedures Procedures (including critical care time)  Medications Ordered in UC Medications - No data to display  Initial Impression / Assessment and Plan / UC Course  I have reviewed the triage vital signs and the nursing notes.  Pertinent labs & imaging results that were available during my care of the patient were reviewed by me and considered in my medical decision making (see chart for details).     Reviewed exam and symptoms with patient.  No red flags.  UA positive for UTI, will culture and start Bactrim.  UA did show glucose greater than thousand but patient declined POCT blood glucose check.  In addition blood pressure was elevated at 183/142.  He declined recheck.  No headache, chest pain, shortness of breath, dizziness or visual changes.  Highly encouraged to follow-up with his PCP ASAP he regarding his blood pressure reading as well as his glucose and urine and he verbalized understanding.  Lots of rest and fluids.  Strict ER precautions reviewed and patient verbalized understanding. Final Clinical Impressions(s) / UC Diagnoses   Final diagnoses:  Acute cystitis with hematuria  Hypertension, unspecified type     Discharge Instructions      The clinic  will contact you with results of the urine culture done today if positive.  Start Bactrim twice daily for 7 days.  Lots of rest and fluids.  Please follow-up with your PCP  in 2 days for recheck and also follow-up with them regarding checking your blood sugar.  Please go to the ER if you develop any worsening symptoms.  I hope you feel better soon!    ED Prescriptions     Medication Sig Dispense Auth. Provider   sulfamethoxazole-trimethoprim (BACTRIM DS) 800-160 MG tablet Take 1 tablet by mouth 2 (two) times daily for 7 days. 14 tablet Radford Pax, NP      PDMP not reviewed this encounter.   Radford Pax, NP 05/07/23 [phone]

## 2023-05-07 NOTE — ED Triage Notes (Signed)
pt presents with c/o painful urination X 2 days. States last week had penile itching.   Denies abd pain, back pain and other sxs.

## 2023-05-07 NOTE — Discharge Instructions (Signed)
The clinic will contact you with results of the urine culture done today if positive.  Start Bactrim twice daily for 7 days.  Lots of rest and fluids.  Please follow-up with your PCP in 2 days for recheck and also follow-up with them regarding checking your blood sugar.  Please go to the ER if you develop any worsening symptoms.  I hope you feel better soon!

## 2023-05-08 LAB — URINE CULTURE

## 2024-03-07 ENCOUNTER — Ambulatory Visit: Admitting: Podiatry

## 2024-03-07 VITALS — Ht 75.0 in | Wt 320.0 lb

## 2024-03-07 DIAGNOSIS — M79675 Pain in left toe(s): Secondary | ICD-10-CM | POA: Diagnosis not present

## 2024-03-07 DIAGNOSIS — M79674 Pain in right toe(s): Secondary | ICD-10-CM

## 2024-03-07 DIAGNOSIS — B351 Tinea unguium: Secondary | ICD-10-CM | POA: Diagnosis not present

## 2024-03-07 MED ORDER — TERBINAFINE HCL 250 MG PO TABS: 250.0000 mg | ORAL_TABLET | Freq: Every day | ORAL | 0 refills | Status: AC

## 2024-03-07 NOTE — Progress Notes (Signed)
   Chief Complaint  Patient presents with   Diabetes    Rm 6 Patient is here for diabetic foot exam and nail trimming. Excessive thickening of the right and left hallux nails and the left index toe nail. Patient has no additional concerns today.    SUBJECTIVE Patient with a history of diabetes mellitus presents to office today complaining of elongated, thickened nails that cause pain while ambulating in shoes.  Patient is unable to trim their own nails. Patient is here for further evaluation and treatment.  Past Medical History:  Diagnosis Date   Obesity    UTI (lower urinary tract infection)     No Known Allergies   LT foot 03/07/2024  RT foot 03/07/2024 OBJECTIVE General Patient is awake, alert, and oriented x 3 and in no acute distress. Derm Skin is dry and supple bilateral. Negative open lesions or macerations. Remaining integument unremarkable. Nails are tender, long, thickened and dystrophic with subungual debris, consistent with onychomycosis, 1-5 bilateral. No signs of infection noted. Vasc  DP and PT pedal pulses palpable bilaterally. Temperature gradient within normal limits.  Neuro grossly intact via light touch Musculoskeletal Exam No symptomatic pedal deformities noted bilateral. Muscular strength within normal limits.  ASSESSMENT 1. Diabetes Mellitus w/ peripheral neuropathy 2.  Pain due to onychomycosis of toenails bilateral  PLAN OF CARE -Patient evaluated today. -Instructed to maintain good pedal hygiene and foot care. Stressed importance of controlling blood sugar.  -Mechanical debridement of nails 1-5 bilaterally performed using a nail nipper. Filed with dremel without incident.  -Today we discussed different treatment modalities for fungal nail infection including oral, topical, and laser antifungal treatment modalities.  Relative efficacy's as well as risks and benefits associated with each modality were explained.  Patient opts for oral antifungal medication.   He does not have a history of liver pathology or symptoms.  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    Thresa EMERSON Sar, DPM [facility]  Dr. Thresa EMERSON Sar, DPM    [address] [place], [place] [address]                Office [phone]  Fax [phone]

## 2024-09-05 ENCOUNTER — Ambulatory Visit: Admitting: Podiatry
